# Patient Record
Sex: Male | Born: 1940 | Race: Black or African American | Hispanic: No | Marital: Married | Smoking: Never smoker
Health system: Southern US, Community
[De-identification: ages and names within clinical notes are randomized; demographics above are authoritative.]

## PROBLEM LIST (undated history)

## (undated) DIAGNOSIS — I712 Thoracic aortic aneurysm, without rupture, unspecified: Secondary | ICD-10-CM

## (undated) DIAGNOSIS — R339 Retention of urine, unspecified: Secondary | ICD-10-CM

## (undated) DIAGNOSIS — C61 Malignant neoplasm of prostate: Secondary | ICD-10-CM

## (undated) DIAGNOSIS — I351 Nonrheumatic aortic (valve) insufficiency: Secondary | ICD-10-CM

## (undated) DIAGNOSIS — M3320 Polymyositis, organ involvement unspecified: Secondary | ICD-10-CM

## (undated) DIAGNOSIS — I10 Essential (primary) hypertension: Secondary | ICD-10-CM

## (undated) HISTORY — DX: Retention of urine, unspecified: R33.9

## (undated) HISTORY — DX: Polymyositis, organ involvement unspecified: M33.20

## (undated) HISTORY — DX: Thoracic aortic aneurysm, without rupture, unspecified: I71.20

## (undated) HISTORY — DX: Essential (primary) hypertension: I10

## (undated) HISTORY — DX: Malignant neoplasm of prostate: C61

## (undated) HISTORY — PX: RADIOACTIVE SEED IMPLANT: SHX5150

## (undated) HISTORY — DX: Nonrheumatic aortic (valve) insufficiency: I35.1

## (undated) HISTORY — DX: Thoracic aortic aneurysm, without rupture: I71.2

---

## 2000-06-12 HISTORY — PX: CARDIOVASCULAR STRESS TEST: SHX262

## 2002-01-31 ENCOUNTER — Emergency Department (HOSPITAL_COMMUNITY): Admission: EM | Admit: 2002-01-31 | Discharge: 2002-01-31 | Payer: Self-pay | Admitting: Emergency Medicine

## 2004-10-17 ENCOUNTER — Ambulatory Visit (HOSPITAL_COMMUNITY): Admission: RE | Admit: 2004-10-17 | Discharge: 2004-10-17 | Payer: Self-pay | Admitting: Gastroenterology

## 2005-10-31 ENCOUNTER — Encounter: Admission: RE | Admit: 2005-10-31 | Discharge: 2005-10-31 | Payer: Self-pay | Admitting: Cardiology

## 2006-09-09 ENCOUNTER — Encounter: Admission: RE | Admit: 2006-09-09 | Discharge: 2006-09-09 | Payer: Self-pay | Admitting: Endocrinology

## 2007-10-29 ENCOUNTER — Encounter: Admission: RE | Admit: 2007-10-29 | Discharge: 2007-10-29 | Payer: Self-pay | Admitting: Cardiology

## 2007-12-01 ENCOUNTER — Ambulatory Visit: Payer: Self-pay | Admitting: Pulmonary Disease

## 2007-12-01 LAB — CONVERTED CEMR LAB
Angiotensin 1 Converting Enzyme: 62 units/L (ref 9–67)
Basophils Absolute: 0 10*3/uL (ref 0.0–0.1)
Basophils Relative: 0 % (ref 0.0–1.0)
Eosinophils Absolute: 0.3 10*3/uL (ref 0.0–0.6)
Eosinophils Relative: 4.6 % (ref 0.0–5.0)
HCT: 39.8 % (ref 39.0–52.0)
Hemoglobin: 13.9 g/dL (ref 13.0–17.0)
INR: 0.9 (ref 0.8–1.0)
Lymphocytes Relative: 24.8 % (ref 12.0–46.0)
MCHC: 34.9 g/dL (ref 30.0–36.0)
MCV: 87.3 fL (ref 78.0–100.0)
Monocytes Absolute: 0.9 10*3/uL — ABNORMAL HIGH (ref 0.2–0.7)
Monocytes Relative: 14 % — ABNORMAL HIGH (ref 3.0–11.0)
Neutro Abs: 3.6 10*3/uL (ref 1.4–7.7)
Neutrophils Relative %: 56.6 % (ref 43.0–77.0)
Platelets: 232 10*3/uL (ref 150–400)
Prothrombin Time: 11.5 s (ref 10.9–13.3)
RBC: 4.56 M/uL (ref 4.22–5.81)
RDW: 13.6 % (ref 11.5–14.6)
Sed Rate: 20 mm/hr (ref 0–20)
WBC: 6.4 10*3/uL (ref 4.5–10.5)
aPTT: 26.3 s (ref 21.7–29.8)

## 2007-12-03 ENCOUNTER — Telehealth (INDEPENDENT_AMBULATORY_CARE_PROVIDER_SITE_OTHER): Payer: Self-pay | Admitting: *Deleted

## 2007-12-04 ENCOUNTER — Encounter: Payer: Self-pay | Admitting: Pulmonary Disease

## 2007-12-05 ENCOUNTER — Ambulatory Visit: Payer: Self-pay | Admitting: Cardiology

## 2007-12-05 ENCOUNTER — Encounter: Payer: Self-pay | Admitting: Pulmonary Disease

## 2007-12-05 DIAGNOSIS — E785 Hyperlipidemia, unspecified: Secondary | ICD-10-CM | POA: Insufficient documentation

## 2007-12-05 DIAGNOSIS — I10 Essential (primary) hypertension: Secondary | ICD-10-CM | POA: Insufficient documentation

## 2007-12-11 ENCOUNTER — Telehealth: Payer: Self-pay | Admitting: Pulmonary Disease

## 2007-12-15 ENCOUNTER — Ambulatory Visit: Payer: Self-pay | Admitting: Pulmonary Disease

## 2007-12-15 DIAGNOSIS — J8410 Pulmonary fibrosis, unspecified: Secondary | ICD-10-CM | POA: Insufficient documentation

## 2007-12-16 DIAGNOSIS — J479 Bronchiectasis, uncomplicated: Secondary | ICD-10-CM

## 2008-01-05 ENCOUNTER — Ambulatory Visit: Payer: Self-pay | Admitting: Pulmonary Disease

## 2008-01-16 ENCOUNTER — Telehealth (INDEPENDENT_AMBULATORY_CARE_PROVIDER_SITE_OTHER): Payer: Self-pay | Admitting: *Deleted

## 2008-01-19 ENCOUNTER — Ambulatory Visit: Payer: Self-pay | Admitting: Pulmonary Disease

## 2008-01-19 ENCOUNTER — Inpatient Hospital Stay (HOSPITAL_COMMUNITY): Admission: AD | Admit: 2008-01-19 | Discharge: 2008-01-29 | Payer: Self-pay | Admitting: Pulmonary Disease

## 2008-01-19 DIAGNOSIS — R0602 Shortness of breath: Secondary | ICD-10-CM | POA: Insufficient documentation

## 2008-01-20 ENCOUNTER — Encounter: Payer: Self-pay | Admitting: Pulmonary Disease

## 2008-01-22 ENCOUNTER — Encounter: Payer: Self-pay | Admitting: Pulmonary Disease

## 2008-01-30 ENCOUNTER — Telehealth: Payer: Self-pay | Admitting: Pulmonary Disease

## 2008-02-05 ENCOUNTER — Encounter: Payer: Self-pay | Admitting: Pulmonary Disease

## 2008-02-05 ENCOUNTER — Ambulatory Visit: Payer: Self-pay | Admitting: Pulmonary Disease

## 2008-02-05 DIAGNOSIS — J82 Pulmonary eosinophilia, not elsewhere classified: Secondary | ICD-10-CM

## 2008-02-05 DIAGNOSIS — J8289 Other pulmonary eosinophilia, not elsewhere classified: Secondary | ICD-10-CM | POA: Insufficient documentation

## 2008-02-05 DIAGNOSIS — J9600 Acute respiratory failure, unspecified whether with hypoxia or hypercapnia: Secondary | ICD-10-CM | POA: Insufficient documentation

## 2008-02-12 ENCOUNTER — Telehealth: Payer: Self-pay | Admitting: Pulmonary Disease

## 2008-03-11 ENCOUNTER — Ambulatory Visit: Payer: Self-pay | Admitting: Pulmonary Disease

## 2008-04-21 ENCOUNTER — Ambulatory Visit: Payer: Self-pay | Admitting: Cardiology

## 2008-04-23 ENCOUNTER — Ambulatory Visit: Payer: Self-pay | Admitting: Pulmonary Disease

## 2008-05-27 ENCOUNTER — Telehealth: Payer: Self-pay | Admitting: Pulmonary Disease

## 2008-05-31 ENCOUNTER — Ambulatory Visit: Payer: Self-pay | Admitting: Pulmonary Disease

## 2008-05-31 ENCOUNTER — Ambulatory Visit: Payer: Self-pay | Admitting: Internal Medicine

## 2008-06-21 ENCOUNTER — Ambulatory Visit: Payer: Self-pay | Admitting: Pulmonary Disease

## 2008-07-23 ENCOUNTER — Ambulatory Visit: Payer: Self-pay | Admitting: Pulmonary Disease

## 2008-08-10 ENCOUNTER — Encounter: Payer: Self-pay | Admitting: Pulmonary Disease

## 2008-08-18 ENCOUNTER — Encounter: Payer: Self-pay | Admitting: Pulmonary Disease

## 2008-09-02 ENCOUNTER — Encounter: Payer: Self-pay | Admitting: Pulmonary Disease

## 2008-09-16 ENCOUNTER — Encounter: Payer: Self-pay | Admitting: Pulmonary Disease

## 2008-10-21 ENCOUNTER — Ambulatory Visit: Payer: Self-pay | Admitting: Pulmonary Disease

## 2008-10-21 DIAGNOSIS — J849 Interstitial pulmonary disease, unspecified: Secondary | ICD-10-CM

## 2009-01-23 IMAGING — CT CT CHEST W/O CM
2 of 3 series · 15 of 36 positions shown, 18 images · non-contrast
Comparison: Chest radiographs 12/01/07 and chest CT 10/31/05.

CLINICAL DATA: Cough and dyspnea.  Right middle lobe opacity on chest radiographs.
CHEST CT WITHOUT CONTRAST ? 12/05/07:
TECHNIQUE: Multidetector CT imaging of the chest was performed following the standard protocol without IV contrast. The current examination does include high-resolution images through the lungs.

[Series 2: chest_hi_res 5.0 b40f st · axial · 0.76mm/px · z∈[+1094,+1384]mm · 12 of 68 slices shown, 15 images]
[im 5/68  mediastinal]
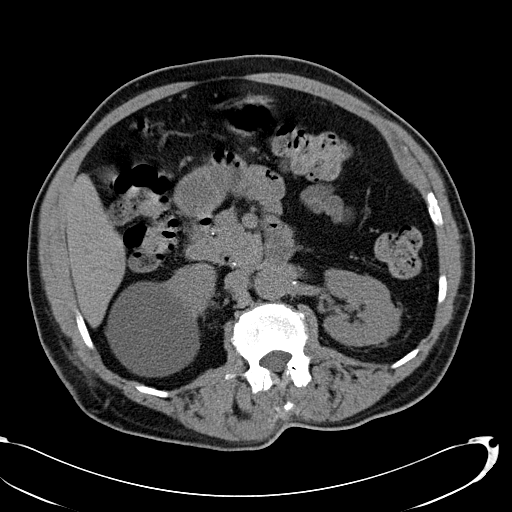
[im 5/68  lung]
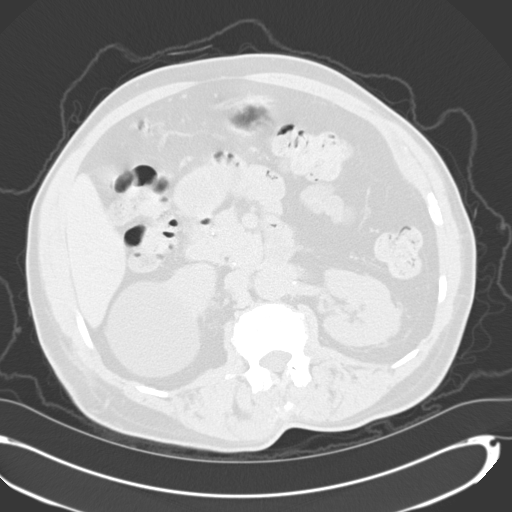
[im 10/68  lung]
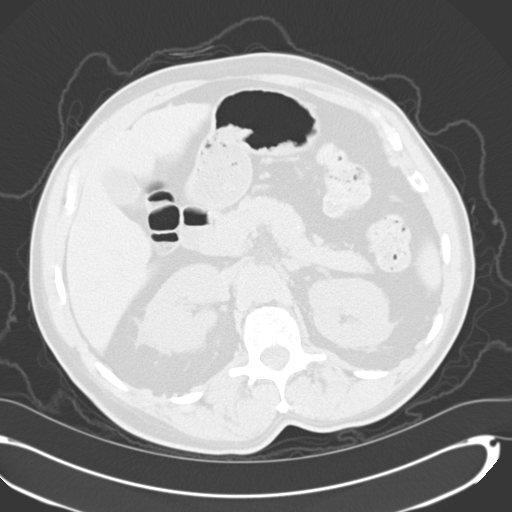
[im 15/68  lung]
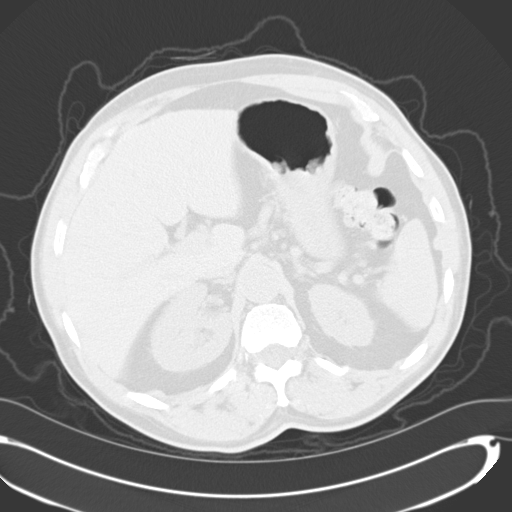
[im 20/68  lung]
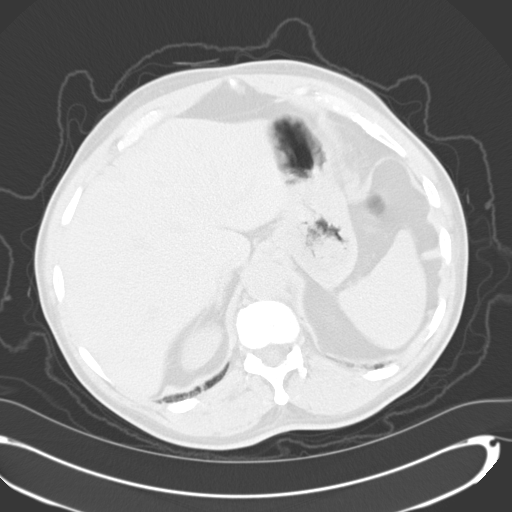
[im 25/68  mediastinal]
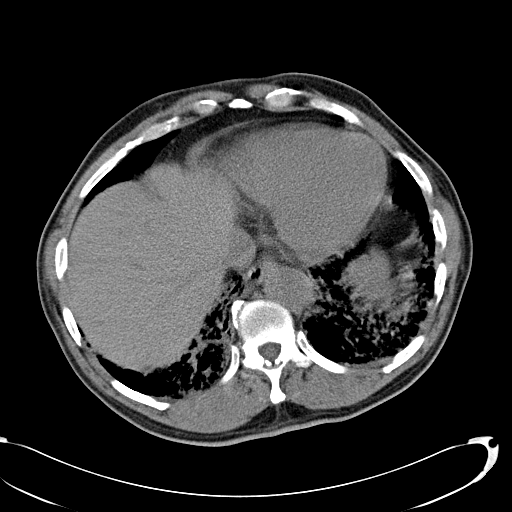
[im 25/68  lung]
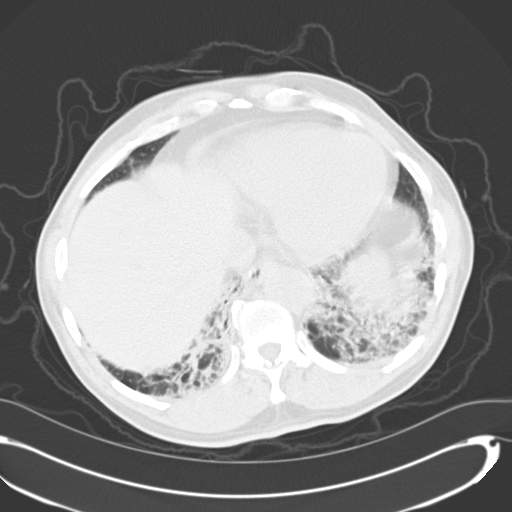
[im 30/68  lung]
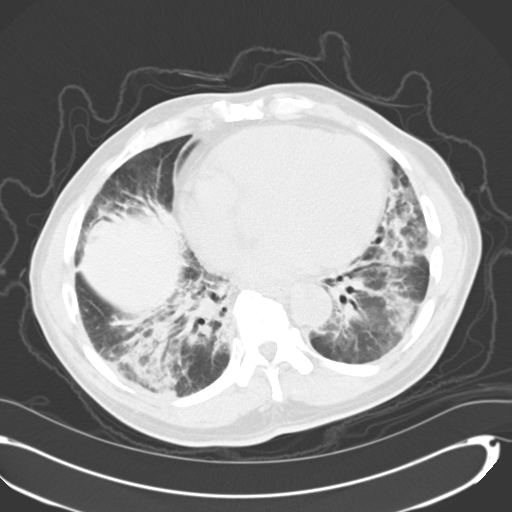
[im 38/68  lung]
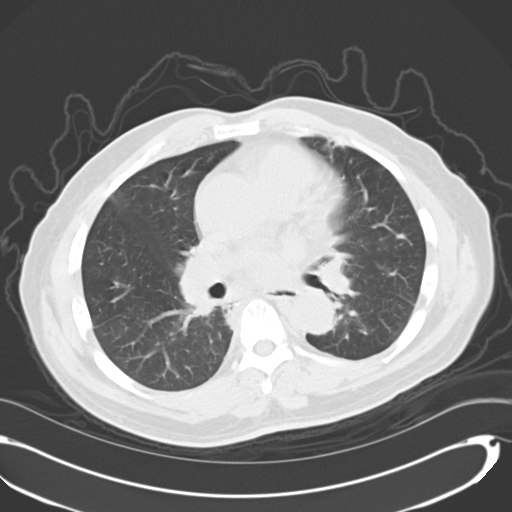
[im 43/68  lung]
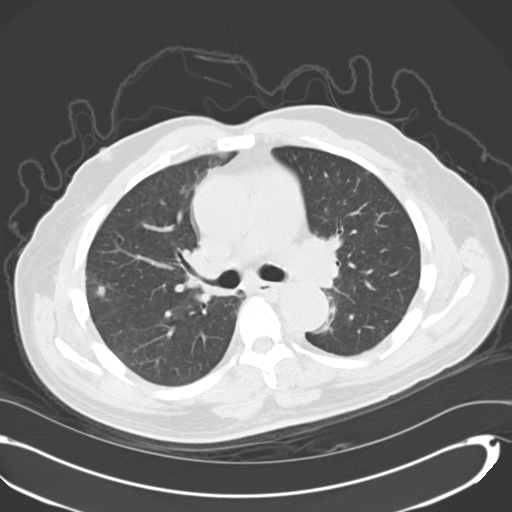
[im 48/68  mediastinal]
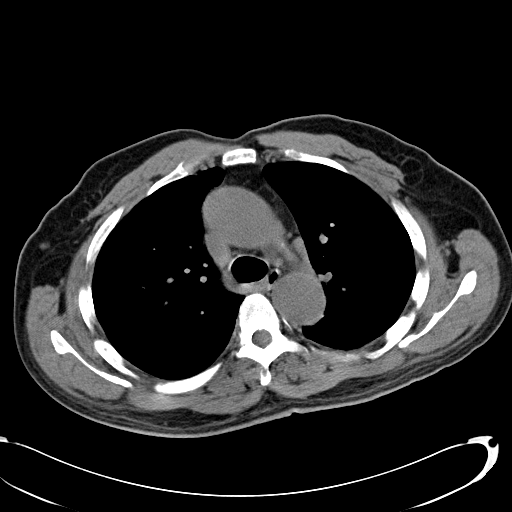
[im 48/68  lung]
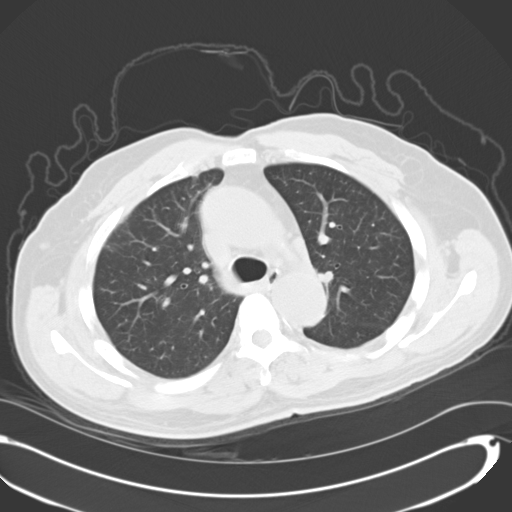
[im 53/68  lung]
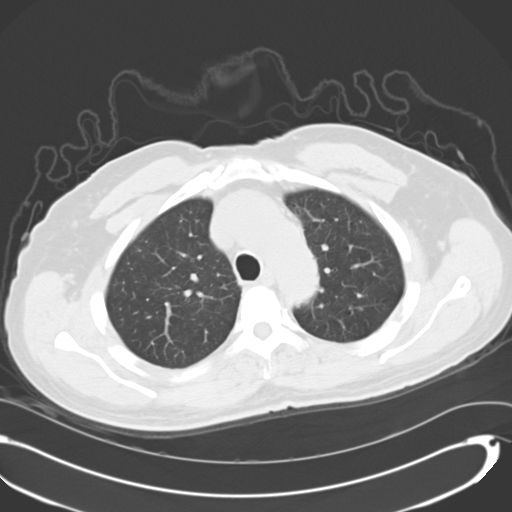
[im 58/68  lung]
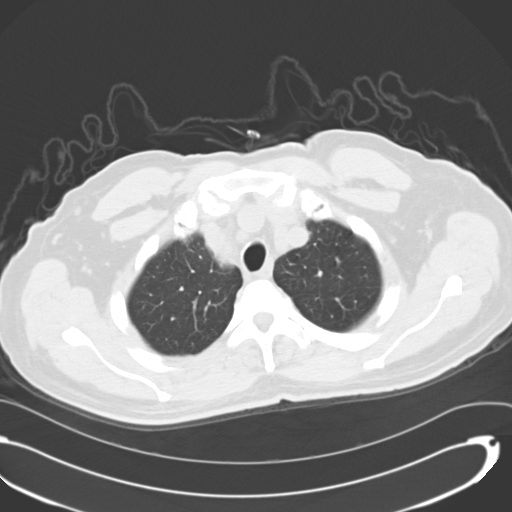
[im 63/68  lung]
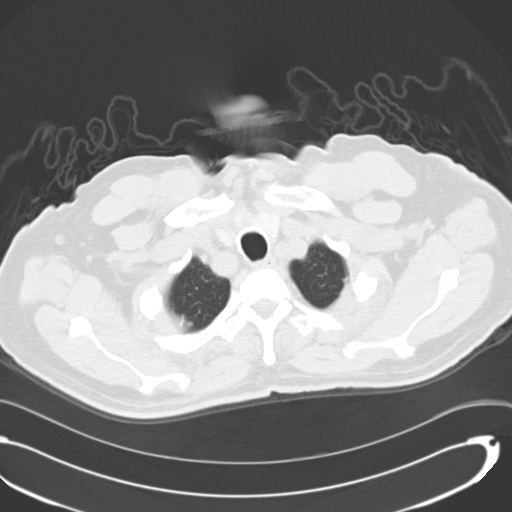

[Series 602: <mpr thick range> · coronal · 0.76mm/px · 3 of 94 slices shown]
[im 19/94  lung]
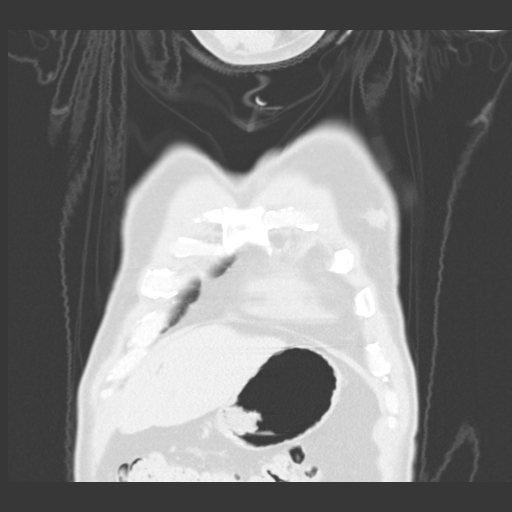
[im 38/94  lung]
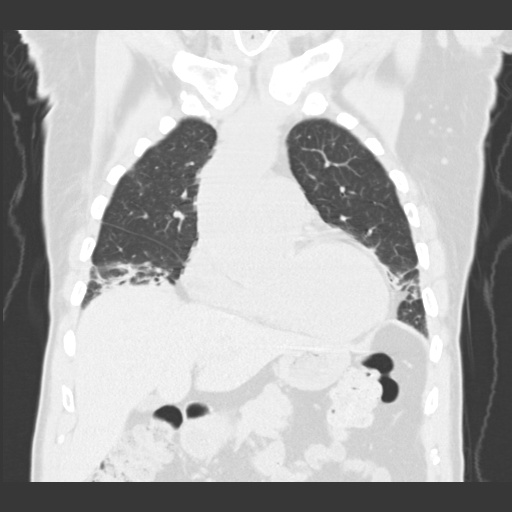
[im 56/94  lung]
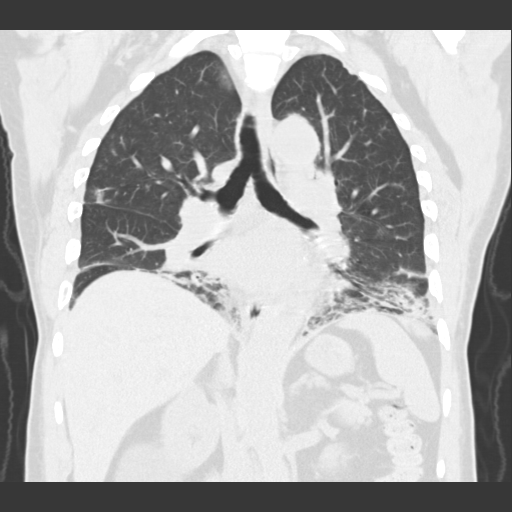

[15 of 36 positions shown; findings below may reference images not displayed]

FINDINGS: There is stable mild cardiac enlargement.  No significant pleural or pericardial effusion is present. There is stable ectasia of the aortic root and pulmonary arteries centrally.  The brachiocephalic artery is also mildly dilated, but appears unchanged. There are no enlarged mediastinal or hilar lymph nodes.  
Lung windows demonstrate bibasilar air space opacities with air bronchograms and bronchiectasis.  These have developed since the patient?s prior CT.  Compared with the chest radiographs done four days ago, some improvement is suspected.  More superiorly, there are focal nodular densities adjacent to the right major and minor fissures on images #25 through #29.  No dominant lung mass or endobronchial lesion is seen.
Images through the upper abdomen demonstrate no adrenal mass. There is a 7.4 x 8.2 cm cyst involving the lower pole of the right kidney. This was not imaged previously.
IMPRESSION: Bibasilar air space opacities are suspected to have both acute and chronic components with slight improvement compared with the radiographs of four days prior.  Chronicity is implied by architectural distortion and bronchiectasis and suggests possible chronic aspiration or chronic inflammation.  Small nodular densities on the right are likely related. 
Clinical correlation and chest radiographic follow-up are recommended.

## 2009-02-28 ENCOUNTER — Ambulatory Visit: Payer: Self-pay | Admitting: Pulmonary Disease

## 2009-03-01 ENCOUNTER — Ambulatory Visit: Admission: RE | Admit: 2009-03-01 | Discharge: 2009-05-30 | Payer: Self-pay | Admitting: Radiation Oncology

## 2009-03-31 ENCOUNTER — Encounter: Admission: RE | Admit: 2009-03-31 | Discharge: 2009-03-31 | Payer: Self-pay | Admitting: Urology

## 2009-05-04 ENCOUNTER — Ambulatory Visit (HOSPITAL_BASED_OUTPATIENT_CLINIC_OR_DEPARTMENT_OTHER): Admission: RE | Admit: 2009-05-04 | Discharge: 2009-05-04 | Payer: Self-pay | Admitting: Urology

## 2009-06-02 ENCOUNTER — Ambulatory Visit: Admission: RE | Admit: 2009-06-02 | Discharge: 2009-06-14 | Payer: Self-pay | Admitting: Radiation Oncology

## 2009-08-29 ENCOUNTER — Ambulatory Visit: Payer: Self-pay | Admitting: Pulmonary Disease

## 2009-10-21 ENCOUNTER — Encounter: Payer: Self-pay | Admitting: Pulmonary Disease

## 2009-11-09 ENCOUNTER — Encounter: Payer: Self-pay | Admitting: Pulmonary Disease

## 2010-02-27 ENCOUNTER — Ambulatory Visit: Payer: Self-pay | Admitting: Pulmonary Disease

## 2010-02-27 ENCOUNTER — Encounter: Payer: Self-pay | Admitting: Pulmonary Disease

## 2010-03-03 ENCOUNTER — Encounter: Payer: Self-pay | Admitting: Pulmonary Disease

## 2010-08-28 ENCOUNTER — Ambulatory Visit: Payer: Self-pay | Admitting: Pulmonary Disease

## 2010-11-30 ENCOUNTER — Encounter: Payer: Self-pay | Admitting: Pulmonary Disease

## 2010-11-30 ENCOUNTER — Ambulatory Visit: Payer: Self-pay | Admitting: Cardiology

## 2010-12-05 ENCOUNTER — Encounter: Admission: RE | Admit: 2010-12-05 | Discharge: 2010-12-05 | Payer: Self-pay | Admitting: Cardiology

## 2010-12-05 ENCOUNTER — Ambulatory Visit: Payer: Self-pay

## 2010-12-05 ENCOUNTER — Encounter: Payer: Self-pay | Admitting: Cardiology

## 2010-12-05 ENCOUNTER — Encounter: Payer: Self-pay | Admitting: Pulmonary Disease

## 2010-12-05 ENCOUNTER — Ambulatory Visit (HOSPITAL_COMMUNITY)
Admission: RE | Admit: 2010-12-05 | Discharge: 2010-12-05 | Payer: Self-pay | Source: Home / Self Care | Admitting: Cardiology

## 2010-12-05 HISTORY — PX: US ECHOCARDIOGRAPHY: HXRAD669

## 2011-01-21 ENCOUNTER — Encounter: Payer: Self-pay | Admitting: Cardiology

## 2011-01-30 NOTE — Letter (Signed)
Summary: [facility]   Imported By: Sherian Rein 02/15/2010 14:17:34  _____________________________________________________________________  External Attachment:    Type:   Image     Comment:   External Document

## 2011-01-30 NOTE — Letter (Signed)
Summary: [facility]  [facility]   Imported By: Lanelle Bal 03/27/2010 14:13:09  _____________________________________________________________________  External Attachment:    Type:   Image     Comment:   External Document

## 2011-01-30 NOTE — Miscellaneous (Signed)
Summary: Orders Update  Clinical Lists Changes  Orders: Added new Test order of T-2 View CXR (71020TC) - Signed 

## 2011-01-30 NOTE — Assessment & Plan Note (Signed)
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  Pt states breathing is unchanged from last visit.  Pt denied any new complaints.  Pt is currently at 2.5mg  of Prednisone.  Pt refused to get scheduled cxr today.  Marland Kitchen  History of Present Illness: The pt comes in today for f/u of his pulmonary infiltrates/dyspnea related to his polymyositis.  He has been doing very well on his immunosuppressive regimen under the guidance of rheumatology.  He reports no worsening sob, cough, congestion, or purulence.  He is due for his f/u cxr.  Current Medications (verified): 1)  Janumet 50-1000 Mg Tabs (Sitagliptin-Metformin Hcl) .... Take 1 Tablet By Mouth Two Times A Day 2)  Azor 5-40 Mg Tabs (Amlodipine-Olmesartan) .... Take 1 Tablet By Mouth Once A Day 3)  Carvedilol 25 Mg Tabs (Carvedilol) .... Take 1/2 Tab By Mouth in The Morning and 1 Tab By Mouth At Bedtime 4)  Finasteride 5 Mg  Tabs (Finasteride) .... Once Daily 5)  Adult Aspirin Ec Low Strength 81 Mg  Tbec (Aspirin) .... Once Daily 6)  Multi , B12 and Vit D3 .... Once Daily 7)  Flomax 0.4 Mg  Cp24 (Tamsulosin Hcl) .... Take 1 Tab By Mouth Every 3 Days 8)  Glimepiride 4 Mg  Tabs (Glimepiride) .... Take 1/2  Tab By Mouth Each Morning 9)  Viagra 100 Mg  Tabs (Sildenafil Citrate) .... As Needed 10)  Prednisone 5 Mg Tabs (Prednisone) .... Take 1/2 Tab By Mouth Daily 11)  Imuran 50 Mg Tabs (Azathioprine) .... Take 3 Tabs By Mouth Daily 12)  Actos 30 Mg Tabs (Pioglitazone Hcl) .... Take 1 Tablet By Mouth Once A Day 13)  Simvastatin 20 Mg Tabs (Simvastatin) .... Take 1 Tablet By Mouth Once A Day 14)  Vitamin D 1000 Unit Tabs (Cholecalciferol) .... Take 1 Tablet By Mouth Once A Day 15)  Vitamin B-12 1000 Mcg Tabs (Cyanocobalamin) .... Take 1 Tablet By Mouth Once A Day  Allergies (verified): 1)  ! * Lisinopril 2)  ! Sulfa  Review of  Systems      See HPI  Vital Signs:  Patient profile:   70 year old male Height:      75.5 inches Weight:      215.25 pounds BMI:     26.65 O2 Sat:      97 % on Room air Temp:     98.0 degrees F oral Pulse rate:   72 / minute BP sitting:   120 / 60  (left arm) Cuff size:   regular  Vitals Entered By: Arman Filter LPN (February 27, 2010 9:11 AM)  O2 Flow:  Room air CC: Pt is here for a 6 month f/u appt.  Pt states breathing is unchanged from last visit.  Pt denied any new complaints.  Pt is currently at 2.5mg  of Prednisone.  Pt refused to get scheduled cxr today.   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  He is having no increased sob, and is staying  very active.  He is due for a cxr for f/u, and I will call him with the results.    Medications Added to Medication List This Visit: 1)  Janumet 50-1000 Mg Tabs (Sitagliptin-metformin hcl) .... Take 1 tablet by mouth two times a day 2)  Azor 5-40 Mg Tabs (Amlodipine-olmesartan) .... Take 1 tablet by mouth once a day 3)  Flomax 0.4 Mg Cp24 (Tamsulosin hcl) .... Take 1 tab by mouth every 3 days 4)  Glimepiride 4 Mg Tabs (Glimepiride) .... Take 1/2  tab by mouth each morning 5)  Prednisone 5 Mg Tabs (Prednisone) .... Take 1/2 tab by mouth daily 6)  Simvastatin 20 Mg Tabs (Simvastatin) .... Take 1 tablet by mouth once a day 7)  Vitamin D 1000 Unit Tabs (Cholecalciferol) .... Take 1 tablet by mouth once a day 8)  Vitamin B-12 1000 Mcg Tabs (Cyanocobalamin) .... Take 1 tablet by mouth once a day  Other Orders: Est. Patient Level II (62952) T-2 View CXR  (71020TC)  Patient Instructions: 1)  will send down for a cxr. 2)  no change in meds. 3)  followup with me in 6mos.

## 2011-01-30 NOTE — Assessment & Plan Note (Signed)
Summary: rov for autoimmune pulmonary disease   Copy to:  Jordan/Deveshwar Primary Provider/Referring Provider:  Lucianne Muss  CC:  Pt is here for a 6 month f/u appt.  Pt is no longer on Prednisone.  Pt denied any changes to  breathing since last visit.  Pt denied a cough, sore throat, headache, and or nasal congestion. 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  The pt has been doing very well from a pulmonary standpoint, and has had no recurrence of his pulmonary symptoms since being off prednisone.  He is continuing on imuran under the direction of rheumatology.  He denies any cough, congestion, mucus, or worsening sob.  He has been trying to stay active.  Current Medications (verified): 1)  Janumet 50-1000 Mg Tabs (Sitagliptin-Metformin Hcl) .... Take 1 Tablet By Mouth Two Times A Day 2)  Azor 5-40 Mg Tabs (Amlodipine-Olmesartan) .... Take 1 Tablet By Mouth Once A Day 3)  Carvedilol 25 Mg Tabs (Carvedilol) .... Take 1 Tablet By Mouth Two Times A Day 4)  Finasteride 5 Mg  Tabs (Finasteride) .... Once Daily 5)  Adult Aspirin Ec Low Strength 81 Mg  Tbec (Aspirin) .... Once Daily 6)  Multi , B12 and Vit D3 .... Once Daily 7)  Flomax 0.4 Mg  Cp24 (Tamsulosin Hcl) .... Take 1 Tablet By Mouth Once A Day 8)  Viagra 100 Mg  Tabs (Sildenafil Citrate) .... As Needed 9)  Imuran 50 Mg Tabs (Azathioprine) .... Take 2 Tabs By Mouth Daily 10)  Actos 30 Mg Tabs (Pioglitazone Hcl) .... Take 1 Tablet By Mouth Once A Day 11)  Simvastatin 20 Mg Tabs (Simvastatin) .... Take 1 Tablet By Mouth Once A Day 12)  Vitamin D 1000 Unit Tabs (Cholecalciferol) .... Take 1 Tablet By Mouth Once A Day 13)  Fabb 2.2-25-1 Mg Tabs (Folic Acid-Vit B6-Vit B12) .... Take 1 Tablet By Mouth Once A Day  Allergies (verified): 1)  ! * Lisinopril 2)  ! Sulfa  Review of Systems  The patient denies shortness of breath with activity, shortness of breath at rest,  productive cough, non-productive cough, coughing up blood, chest pain, irregular heartbeats, acid heartburn, indigestion, loss of appetite, weight change, abdominal pain, difficulty swallowing, sore throat, tooth/dental problems, headaches, nasal congestion/difficulty breathing through nose, sneezing, itching, ear ache, anxiety, depression, hand/feet swelling, joint stiffness or pain, rash, change in color of mucus, and fever.    Vital Signs:  Patient profile:   70 year old male Height:      75.5 inches Weight:      212.13 pounds O2 Sat:      94 % on Room air Temp:     97.6 degrees F oral Pulse rate:   70 / minute BP sitting:   140 / 50  (left arm) Cuff size:   regular  Vitals Entered By: Arman Filter LPN (August 28, 2010 9:11 AM)  O2 Flow:  Room air CC: Pt is here for a 6 month f/u appt.  Pt is no longer on Prednisone.  Pt denied any changes to  breathing since last visit.  Pt denied a cough, sore throat, headache, or nasal congestion.  Comments Medications reviewed with patient Arman Filter LPN  August 28, 2010 9:12 AM    Physical Exam  General:  wd male in nad Lungs:  minimal basilar crackles, no wheezing Heart:  rrr Extremities:  no edema or cyanosis  Neurologic:  alert and  oriented, moves all 4.   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  He is getting consistent exercise, and has not had any flareups with his breathing.  I have asked him to work on modest weight loss, and to call if he has any worsening of his symptoms.  He will f/u with me in 6mos.  Medications Added to Medication List This Visit: 1)  Carvedilol 25 Mg Tabs (Carvedilol) .... Take 1 tablet by mouth two times a day 2)  Flomax 0.4 Mg Cp24 (Tamsulosin hcl) .... Take 1 tablet by mouth once a day 3)  Imuran 50 Mg Tabs (Azathioprine) .... Take 2 tabs  by mouth daily 4)  Fabb 2.2-25-1 Mg Tabs (Folic acid-vit b6-vit b12) .... Take 1 tablet by mouth once a day  Other Orders: Est. Patient Level III (29562)  Patient Instructions: 1)  continue to work on conditioning and weight loss 2)  followup with me in 6mos.

## 2011-01-30 NOTE — Letter (Signed)
Summary: [facility]  [facility]   Imported By: Sherian Rein 02/15/2010 13:14:26  _____________________________________________________________________  External Attachment:    Type:   Image     Comment:   External Document

## 2011-02-07 NOTE — Letter (Signed)
Summary: Peter Swaziland MD/[facility]  Peter Swaziland MD/[facility]   Imported By: Lester Bono 02/01/2011 10:24:51  _____________________________________________________________________  External Attachment:    Type:   Image     Comment:   External Document

## 2011-02-26 ENCOUNTER — Encounter: Payer: Self-pay | Admitting: Pulmonary Disease

## 2011-02-26 ENCOUNTER — Ambulatory Visit (INDEPENDENT_AMBULATORY_CARE_PROVIDER_SITE_OTHER): Payer: Medicare Other | Admitting: Pulmonary Disease

## 2011-02-26 DIAGNOSIS — J8410 Pulmonary fibrosis, unspecified: Secondary | ICD-10-CM

## 2011-02-26 DIAGNOSIS — J8409 Other alveolar and parieto-alveolar conditions: Secondary | ICD-10-CM

## 2011-02-26 DIAGNOSIS — J479 Bronchiectasis, uncomplicated: Secondary | ICD-10-CM

## 2011-03-08 NOTE — Assessment & Plan Note (Signed)
Summary: rov for pulmonary infiltrates.   Copy to:  Jordan/Deveshwar Primary Provider/Referring Provider:  Lucianne Muss  CC:  6 month f/u appt.   Pt denied sob or cough.  Pt does c/o occ headaches x last 2 to 3 weeks and wonders if this is d/t nasal congestion. Marland Kitchen  History of Present Illness: the pt comes in today for f/u of his known pulmonary disease related to his polymyositis.  He has been doing very well since starting imuran under the guidance of rheum.  He is exercising regularly, and feels his pulmonary status has been stable.  He has no significant doe or cough.  He has not had any hemoptysis.    Current Medications (verified): 1)  Janumet 50-1000 Mg Tabs (Sitagliptin-Metformin Hcl) .... Take 1 Tablet By Mouth Two Times A Day 2)  Azor 5-40 Mg Tabs (Amlodipine-Olmesartan) .... Take 1 Tablet By Mouth Once A Day 3)  Carvedilol 25 Mg Tabs (Carvedilol) .... Take 1 Tablet By Mouth Two Times A Day 4)  Finasteride 5 Mg  Tabs (Finasteride) .... Once Daily 5)  Adult Aspirin Ec Low Strength 81 Mg  Tbec (Aspirin) .... Once Daily 6)  Multi , B12 and Vit D3 .... Once Daily 7)  Flomax 0.4 Mg  Cp24 (Tamsulosin Hcl) .... Take 1 Tablet By Mouth Once A Day 8)  Viagra 100 Mg  Tabs (Sildenafil Citrate) .... As Needed 9)  Imuran 50 Mg Tabs (Azathioprine) .... Take 2 Tabs By Mouth Daily 10)  Actos 30 Mg Tabs (Pioglitazone Hcl) .... Take 1 Tablet By Mouth Once A Day 11)  Simvastatin 20 Mg Tabs (Simvastatin) .... Take 1 Tablet By Mouth Once A Day 12)  Vitamin D 1000 Unit Tabs (Cholecalciferol) .... Take 1 Tablet By Mouth Once A Day 13)  Fabb 2.2-25-1 Mg Tabs (Folic Acid-Vit B6-Vit B12) .... Take 1 Tablet By Mouth Once A Day 14)  Lumigan 0.01 % Soln (Bimatoprost) .... Use As Directed  Allergies (verified): 1)  ! * Lisinopril 2)  ! 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  The patient denies shortness of breath with activity, shortness of breath at rest, productive cough, non-productive cough, coughing up blood, chest pain, irregular heartbeats, acid heartburn, indigestion, loss of appetite, weight change, abdominal pain, difficulty swallowing, sore throat, tooth/dental problems, sneezing, itching, anxiety, depression, hand/feet swelling, rash, change in color of mucus, and fever.    Vital Signs:  Patient profile:   70 year old male Height:      75.5 inches Weight:      211.50 pounds BMI:     26.18 O2 Sat:      100 % on Room air Temp:     97.5 degrees F oral Pulse rate:   62 / minute BP sitting:   134 / 76  (left arm) Cuff size:   regular  Vitals Entered By: Arman Filter LPN (February 26, 2011 9:17 AM)  O2 Flow:  Room air CC: 6 month f/u appt.   Pt denied sob or cough.  Pt does c/o occ headaches x last 2 to 3 weeks and wonders if this is d/t nasal congestion.  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  He has done well since being treated with imuran by rheum.  He tells me his CPK's are down.  He has no significant pulmonary symptoms at this time.  Will see back in 6mos,  and will check cxr on the same day.    Medications Added to Medication List This Visit: 1)  Lumigan 0.01 % Soln (Bimatoprost) .... Use as directed  Other Orders: Est. Patient Level III (40981)  Patient Instructions: 1)  no change in treatment.  Continue your exercise program 2)  followup with me in 6mos, with chest xray before the visit.

## 2011-04-10 LAB — GLUCOSE, CAPILLARY
Glucose-Capillary: 151 mg/dL — ABNORMAL HIGH (ref 70–99)
Glucose-Capillary: 166 mg/dL — ABNORMAL HIGH (ref 70–99)

## 2011-04-11 LAB — COMPREHENSIVE METABOLIC PANEL
ALT: 21 U/L (ref 0–53)
Albumin: 3.7 g/dL (ref 3.5–5.2)
Alkaline Phosphatase: 35 U/L — ABNORMAL LOW (ref 39–117)
Potassium: 4.3 mEq/L (ref 3.5–5.1)
Sodium: 139 mEq/L (ref 135–145)
Total Protein: 6.7 g/dL (ref 6.0–8.3)

## 2011-04-11 LAB — CBC
HCT: 39.3 % (ref 39.0–52.0)
Hemoglobin: 13.2 g/dL (ref 13.0–17.0)
MCHC: 33.5 g/dL (ref 30.0–36.0)
MCV: 95.8 fL (ref 78.0–100.0)
Platelets: 178 10*3/uL (ref 150–400)
RBC: 4.1 MIL/uL — ABNORMAL LOW (ref 4.22–5.81)
RDW: 15.1 % (ref 11.5–15.5)
WBC: 5.1 10*3/uL (ref 4.0–10.5)

## 2011-04-11 LAB — PROTIME-INR: INR: 1.1 (ref 0.00–1.49)

## 2011-05-09 ENCOUNTER — Other Ambulatory Visit: Payer: Self-pay | Admitting: Endocrinology

## 2011-05-09 DIAGNOSIS — E041 Nontoxic single thyroid nodule: Secondary | ICD-10-CM

## 2011-05-15 NOTE — H&P (Signed)
NAME:  Mark Daugherty, Mark Daugherty               ACCOUNT NO.:  [id number]   MEDICAL RECORD NO.:  [id number]          PATIENT TYPE:  INP   LOCATION:  1309                         [facility]   PHYSICIAN:  Barbaraann Share, MD,FCCPDATE OF BIRTH:  1939-11-15   DATE OF ADMISSION:  01/19/2008  DATE OF DISCHARGE:                              HISTORY & PHYSICAL   CHIEF COMPLAINT:  Cough, congestion and shortness of breath that have  progressively worsened over the last 2 months.   HISTORY OF PRESENT ILLNESS:  This is a 70 year old African American male  patient of Dr. Shelle Iron.  The patient returns today complaining of  persistent cough, congestion and shortness of breath.  The patient  reports that about 2 months ago he started having progressively  worsening cough and  congestion.  He received antibiotics.  Symptoms  minimally improved.  A chest x-ray showed bilateral pulmonary  infiltrates.  A subsequent CT chest showed bibasilar airspace opacities  suspected to have both acute and chronic components with a slight  improvement compared to previous chest x-ray.  There were also small  nodular densities on the right, and there was a suspected bronchiectasis  with possible chronic aspiration or chronic inflammation.  The patient  had returned back to the office 2 weeks ago, reporting that his symptoms  were slowly improving and the patient was slowly improving.  The patient  returns today, reporting that over the last 10 days his symptoms have  started to return and have progressively worsened to the point where he  is short of breath with minimal activity.  He denies any hemoptysis,  orthopnea, PND, or leg swelling.  The patient does have a cough with  congestion; however, mucus is clear to white.  The patient complains he  feels extremely thirsty with dry mouth.  He has had some low-grade  fevers.  Denies any fevers over 101.   PAST MEDICAL HISTORY:  1. Hypertension.  2. Diabetes mellitus.  3.  Hyperlipidemia.  4. BPH.  5. Aortic aneurysm.   CURRENT MEDICATIONS:  1. Metformin 1000 mg b.i.d.  2. Amaryl 4 mg daily.  3. Azor 10/40 mg daily.  4. Finasteride 5 mg daily.  5. Crestor 5 mg daily.  6. Aspirin 81 mg daily.  7. Multivitamin daily.  8. Coreg 12.5 mg b.i.d.  9. Symbicort 160/4.5 2 puffs twice daily.   DRUG ALLERGIES:  1. LISINOPRIL.  2. SULFA.   FAMILY HISTORY:  The patient is married.  He is a never smoker.  Has  three children.  He denies any alcohol use.   FAMILY HISTORY:  Positive for diabetes, hypertension, heart disease, and  prostate, liver and pancreatic cancer.   REVIEW OF SYSTEMS:  Essentially negative except as noted above.   PHYSICAL EXAMINATION:  GENERAL:  The patient is an elderly male in no  acute distress.  VITAL SIGNS:  Temperature 97.3, blood pressure 150/60, O2 saturation was  82% on arrival, on room air.  Pulse is 106.  Weight is at 202.  HEENT: Atraumatic.  Normocephalic.  Posterior pharynx is clear.  NECK:  Supple, without  cervical adenopathy.  No JVD.  Carotids are equal  with positive upstrokes bilaterally, without any bruits noted.  LUNG SOUNDS:  Coarse breath sounds bilaterally with some bibasilar  crackles.  CARDIAC:  Regular rate and rhythm, without murmur, rub or gallop.  ABDOMEN:  Soft, nontender.  No palpable hepatosplenomegaly.  No  abdominal bruits or masses are appreciated.  EXTREMITIES:  Warm, without any calf cyanosis, clubbing or edema.   DATA:  Chest x-ray today, in the office, showed no significant changes  in the bilateral pulmonary infiltrates.   IMPRESSION AND PLAN:  1. Bilateral pulmonary infiltrates with associated hypoxemia.  The      patient will be admitted, started on IV antibiotics, oxygen and      nebulized bronchodilators.  Labs, including sed rate CBC, CMET, BNP      are pending.  Sputum culture with AFB and urine for Legionella      antigen are pending.  As condition improves, will need to evaluate       for possible fiberoptic bronchoscopy.  The patient will be placed      on GI prophylaxis with Protonix and DVT prophylaxis with Lovenox.  2. Diabetes mellitus.  We will hold metformin for now.  Will check      CBGs daily.  Continue on Amaryl daily.      Rubye Oaks, NP      Barbaraann Share, MD,FCCP  Electronically Signed    TP/MEDQ  D:  01/19/2008  T:  01/20/2008  Job:  [id number]   cc:   Reather Littler, M.D.  Fax: [phone]   Peter M. Swaziland, M.D.  Fax: [phone]

## 2011-05-15 NOTE — Assessment & Plan Note (Signed)
[facility]                             PULMONARY OFFICE NOTE   NAME:Gemme, Syler                        MRN:          [id number]  DATE:12/01/2007                            DOB:          1941-08-05    HISTORY OF PRESENT ILLNESS:  The patient is a very pleasant black  gentleman whom I have been asked to see for abnormal chest x-ray and  persistent pulmonary symptoms.  The patient states that he has a long  history of allergies, but sine the summer has had more difficulties with  this as well as some wheezing noted in the evening after mowing the  grass.  He has really not had any shortness of breath or limitation in  his activities during the summer.  However, this past September, he  began to notice slowly increasing shortness of breath of unknown  etiology that would start to interfere with some of his activities.  He  was felt to possibly have asthma and was tried on a Flovent inhaler.  Since that time, his symptoms have gotten worse.  He started developing  a cough around the beginning of November 2008, with no purulence, and  only white scant mucus.  The mucus was primarily in the mornings.  There  was no hemoptysis.  The patient was seen by Dr. Lucianne Muss on November 07, 2007, with these worsening symptoms, and a chest x-ray showed bibasilar  infiltrates and possibly lymphadenopathy. The patient was placed on  Avelox that he took for 7 days.  He saw no big difference in his  symptoms.  The patient did not have any fevers or chills, but did have  night sweats on two occasions, which was severe enough to require  changing his bedclothes.  The patient had a followup with Dr. Lucianne Muss on  November 19, 2007, where his chest x-ray was unchanged, and his symptoms  had actually either stayed the same or gotten worse.  Currently, the  patient states that his cough and shortness of breath are still present,  with moderate activity, but he thinks they may have  gotten better over  the last 7-10 days.  Of note, the patient did have a CT scan of the  chest on November 01, 2007 with Dr. [place] to look at his aorta and had  no significant abnormality of the chest, except for a very small tiny  nodule at the right base.  There were no infiltrates noted on that scan  or lymphadenopathy.  The patient has no history of TB exposure, and has  had a negative PPD in the past.  He has no unusual pets or hobbies.  He  has not been around any unusual material other than a [place] that he  uses along the bank of his pond and also a chemical that he puts in his  pond water as well that he did not breathe in.   PAST MEDICAL HISTORY:  1. Hypertension.  2. History of dilated ascending aorta that is being watched very      carefully.  3. History of diabetes.  4. History of allergic rhinitis.   CURRENT MEDICATIONS:  1. Metformin 1000 mg b.i.d.  2. Glimepiride 4 mg daily.  3. Azor 10/40 daily.  4. Carvedilol 12.5, 2 daily.  5. Finasteride 5 mg daily.  6. Crestor 5 mg every other day.  7. Aspirin 81 mg daily.  8. Flomax 0.4 mg p.r.n.  9. Viagra 100 mg p.r.n.   The patient has intolerance/allergy to LISINOPRIL and SULFA DRUGS.   SOCIAL HISTORY:  He is married and has children.  He is a retired  Software engineer.  He has never smoked.   FAMILY HISTORY:  Remarkable for his brother having heart disease.  Sister had pancreatic and liver cancer.   REVIEW OF SYSTEMS:  As per history of present illness.  Also, see  patient intake form in the chart.   PHYSICAL EXAMINATION:  GENERAL:  He is a well-developed male in no acute  distress.  VITAL SIGNS:  Blood pressure 122/68, pulse 84, temperature 98.4, weight  207 pounds.  He is 6 feet 3-1/2 inches tall.  O2 saturation on room air  is 95%.  HEENT:  Pupils equal, round, and reactive to light and accommodation.  Extraocular muscles are intact.  Nares are patent, without discharge.  Oropharynx is clear.  NECK:   Supple, without JVD or lymphadenopathy.  There is no palpable  thyromegaly.  CHEST:  Reveals bibasilar crackles approximately a third of the way up,  but no wheezes or rhonchi.  CARDIAC:  Reveals regular rate and rhythm, with a 3/6 systolic murmur.  ABDOMEN:  Soft, nontender, with good bowel sounds.  GU/RECTAL/BREASTS:  Not done, and not indicated.  LOWER EXTREMITIES:  Without edema.  Pulses are intact distally.  NEUROLOGIC:  Alert and oriented, with no obvious observable motor  defects.   IMPRESSION:  Pulmonary infiltrates of unknown etiology.  These were  absent on November 01, 2007, even though he had had worsening symptoms  over the last one month.  He had obvious bibasilar infiltrates that were  very significant noted on November 07, 2007, and with no change on  November 19, 2007.  He currently has crackles at least a third of the  way up bilaterally on exam today.  There is nothing to indicate a  possible infection, and he really did not see a big improvement with  Avelox.  However, he is noting that his symptoms are starting to subside  slowly.  It is really unclear to me whether this is some type of unusual  infection or whether this is just simply an inflammatory lung issue such  as sarcoid, BOOP, or possibly hypersensitivity pneumonitis.  Since the  patient has not had an x-ray in almost two weeks, I would like to go  ahead and repeat this, understanding that he does still have crackles on  exam.  If he has had significant improvement in his x-ray, then I think  we should just give him some time and see how things go.  If he has had  no change in his x-ray, then we will get back all of the appropriate  blood tests and consider whether or not he needs to have fiberoptic  bronchoscopy.   PLAN:  1. We will check sed rate, angiotensin-converting enzyme, IgE level,      CBC with differential, and also PT/PTT today.  2. Follow up PA and lateral chest x-ray.  3. I will contact the  patient after the above and discuss further      planning.  Barbaraann Share, MD,FCCP  Electronically Signed    [facility]  DD: 12/01/2007  DT: 12/01/2007  Job #: [id number]   cc:   Reather Littler, M.D.

## 2011-05-15 NOTE — Op Note (Signed)
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   OPERATION:  Prostate brachytherapy, cystoscopy.   ANESTHESIA:  General.   Brune:  Courtney Paris, M.D.   ASSISTANT:  Chipper Herb.   BRIEF HISTORY:  This 70 year old patient presents with a T1A 3 +3  adenocarcinoma of the prostate with positive biopsies at the right apex  for brachytherapy.  He had a negative biopsy in 2005.  His PSA was 3.2.  He enters now for definitive treatment.  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   The patient was placed on the operating table in dorsal lithotomy  position.  After satisfactory induction of general anesthesia, he was  prepped and draped with Betadine in the usual sterile fashion.  Time-out  was then performed and the patient and procedure then reconfirmed.   A rectal tube and the 7.5 MHz probe was inserted into the rectum and a  Foley catheter into the bladder.  Dr. Dayton Scrape then proceeded to plan the  treatment with the nuclear physicist.  When this was done, I was called  back to the OR, where again a second time-out was then performed and the  patient and procedure again reconfirmed.  The seeds were then implanted  in the prostate, using ultrasonic guidance.  The total of 28 needles  with 68 seeds were implanted of I125.  The patient tolerated this well  and the post-implant fluoroscopy films looked quite good.   The Foley catheter was then removed.  He was reprepped and draped and a  cysto was then  performed and the anterior urethra was normal.  The post  urethra showed some mild prostate enlargement and the bladder entered.  No seeds were present within the bladder.  The rest of the bladder was  normal.  Scope was then removed, Foley catheter then reinserted and the  patient taken to the recovery room in good condition.  He will be later  discharged as an outpatient and stay on his finasteride and his Flomax  for now.  The catheter will come out in two days.  He will come back to  the office in three weeks for follow-up.     Courtney Paris, M.D.  Electronically Signed    [facility]/MEDQ  D:  05/04/2009  T:  05/04/2009  Job:  [id number]

## 2011-05-15 NOTE — Discharge Summary (Signed)
NAME:  Mark Daugherty, Mark Daugherty               ACCOUNT NO.:  [id number]   MEDICAL RECORD NO.:  [id number]          PATIENT TYPE:  INP   LOCATION:  1514                         FACILITY:  [facility]   PHYSICIAN:  Barbaraann Share, MD,FCCPDATE OF BIRTH:  05-03-39   DATE OF ADMISSION:  01/19/2008  DATE OF DISCHARGE:  01/29/2008                               DISCHARGE SUMMARY   DIAGNOSES AT TIME OF DISCHARGE:  1. Acute respiratory failure secondary to pulmonary infiltrates of      unknown etiology.  2. Diabetes with decreased control secondary high-dose steroids.   PROCEDURE:  Flexible fiberoptic bronchoscopy.   HISTORY OF PRESENT ILLNESS:  Please see dictated H&P at time of  admission.   HOSPITAL COURSE:  The patient was admitted with acute respiratory  failure secondary to pulmonary infiltrates of unknown etiology.  These  did not appear secondary to congestive heart failure, given the  patient's normal echocardiogram prior to admission and also a low brain  natruretic peptide.  The patient was placed on broad-spectrum  antibiotics in the event this represented infection with his known  history of bronchiectasis; however, he continued to have fever and  worsening clinical status.  Ultimately, the patient had worsening acute  respiratory failure requiring high-flow oxygen in order to maintain O2  saturations, and therefore underwent fiberoptic bronchoscopy with  lavage.  This failed to show any significant infection, and the cell  count appeared more inflammatory than anything else.  Due to the  patient's deteriorating condition, he was placed on empiric IV Solu-  Medrol, and his antibiotics were discontinued since they failed to grow  any organisms.  The patient had a dramatic response with resolution of  fevers, as well as improving chest x-ray and O2 saturations.  He was  ultimately transitioned to the regular floor and placed on low-flow  nasal oxygen which he tolerated quite well.  By the day  of discharge, he  was ambulating in the hall with portable oxygen and maintaining O2  saturations even on high-dose prednisone orally.  He has had no further  fever or anything to suggest an acute infection.  The patient will need  to be maintained on prednisone as an outpatient and will be followed  carefully.  The patient did have difficulties with blood sugars during  his hospitalization due to his diabetes and high-dose steroids.  He did  much better once he was started on his home medications and converted  over to oral rather than IV dosing.  By the day of discharge, his blood  sugars were adequate for going home.   DISCHARGE MEDICATIONS:  His discharge medications include his usual home  medications.  His only new medicine will be prednisone 20 mg 3 tablets  each a.m.   FOLLOWUP:  Follow up with will be me next week.  I have provided the  patient with the office number and asked him to make an appointment.   DIET:  Diabetic.   ACTIVITY:  As tolerated.   CONDITION ON DISCHARGE:  Much improved.      Barbaraann Share, MD,FCCP  Electronically Signed  [facility]  D:  01/29/2008  T:  01/29/2008  Job:  [id number]   cc:   Reather Littler, M.D.  Fax: [phone]

## 2011-05-18 ENCOUNTER — Ambulatory Visit
Admission: RE | Admit: 2011-05-18 | Discharge: 2011-05-18 | Disposition: A | Discharge status: Home / Self Care | Payer: Medicare Other | Source: Ambulatory Visit | Attending: Endocrinology | Admitting: Endocrinology

## 2011-05-18 DIAGNOSIS — E041 Nontoxic single thyroid nodule: Secondary | ICD-10-CM

## 2011-05-18 NOTE — Op Note (Signed)
NAME:  Mark Daugherty, Mark Daugherty               ACCOUNT NO.:  [id number]   MEDICAL RECORD NO.:  [id number]          PATIENT TYPE:  AMB   LOCATION:  ENDO                         FACILITY:  [facility]   PHYSICIAN:  James L. Malon Kindle., M.D.DATE OF BIRTH:  1941-08-11   DATE OF PROCEDURE:  10/17/2004  DATE OF DISCHARGE:                                 OPERATIVE REPORT   PROCEDURE:  Colonoscopy.   ENDOSCOPIST:  Llana Aliment. Randa Evens, M.D.   ANESTHESIA:  Fentanyl 90 mcg, Versed 9 mg IV.   SCOPE:  Olympus pediatric colonoscope.   INDICATIONS FOR PROCEDURE:  Colon cancer screening.   DESCRIPTION OF PROCEDURE:  The procedure had been explained to the patient  and consent obtained. With the patient in the left lateral decubitus  position, the cope was inserted and advanced.  The prep was excellent.  Using abdominal pressure, positioning, searches were able to pass the cecum,  ileocecal valve and appendiceal orifice.  The prep was excellent.  The colon  was examined, ascending colon, descending and transverse colon were seen  well. No polyps were seen. The scope was withdrawn through rectum. The  patient tolerated the procedure well.   ASSESSMENT:  Normal screening colonoscopy, V76.51.   PLAN:  Routine post colonoscopy instructions. Recommend checking the stool  yearly.       JLE/MEDQ  D:  10/17/2004  T:  10/17/2004  Job:  [id number]   cc:   Reather Littler, M.D.  [address], [address]  [place]  [place] [address]  Fax: [phone]

## 2011-08-28 ENCOUNTER — Ambulatory Visit: Payer: Medicare Other | Admitting: Pulmonary Disease

## 2011-08-31 ENCOUNTER — Encounter: Payer: Self-pay | Admitting: Cardiology

## 2011-09-07 ENCOUNTER — Ambulatory Visit (INDEPENDENT_AMBULATORY_CARE_PROVIDER_SITE_OTHER)
Admission: RE | Admit: 2011-09-07 | Discharge: 2011-09-07 | Disposition: A | Discharge status: Home / Self Care | Payer: Medicare Other | Source: Ambulatory Visit | Attending: Pulmonary Disease | Admitting: Pulmonary Disease

## 2011-09-07 ENCOUNTER — Encounter: Payer: Self-pay | Admitting: Pulmonary Disease

## 2011-09-07 ENCOUNTER — Ambulatory Visit (INDEPENDENT_AMBULATORY_CARE_PROVIDER_SITE_OTHER): Payer: Medicare Other | Admitting: Pulmonary Disease

## 2011-09-07 VITALS — BP 124/60 | HR 66 | Temp 97.9°F | Ht 75.5 in | Wt 214.4 lb

## 2011-09-07 DIAGNOSIS — J479 Bronchiectasis, uncomplicated: Secondary | ICD-10-CM

## 2011-09-07 DIAGNOSIS — J8409 Other alveolar and parieto-alveolar conditions: Secondary | ICD-10-CM

## 2011-09-07 NOTE — Assessment & Plan Note (Signed)
The pt has not had any issues with pulmonary infections.

## 2011-09-07 NOTE — Patient Instructions (Signed)
Your cxr looks stable today.  Good news Continue to stay as active as you can Will schedule for breathing studies at your convenience in the next 4 weeks, and will call you with results.  followup with me in 6mos.

## 2011-09-07 NOTE — Progress Notes (Signed)
  Subjective:    Patient ID: Mark Daugherty, male    DOB: September 29, 1941, 70 y.o.   MRN: [id number]  HPI The patient comes in today for followup of his lung disease associated with polymyositis.  He has had an alveolar component in the past, but this resolved on Imuran per his rheumatologist.  He has lower lobe chronic interstitial disease which is very mild, and has really not impacted his exertional tolerance.  The patient is staying very active currently, and denies any significant cough or mucus production.  He has not had recent PFTs, and we'll need to get these done to keep up with baseline.   Review of Systems  Constitutional: Negative for fever and unexpected weight change.  HENT: Positive for congestion. Negative for ear pain, nosebleeds, sore throat, rhinorrhea, sneezing, trouble swallowing, dental problem, postnasal drip and sinus pressure.   Eyes: Negative for redness and itching.  Respiratory: Negative for cough, chest tightness, shortness of breath and wheezing.   Cardiovascular: Negative for palpitations and leg swelling.  Gastrointestinal: Negative for nausea and vomiting.  Genitourinary: Negative for dysuria.  Musculoskeletal: Negative for joint swelling.  Skin: Negative for rash.  Neurological: Negative for headaches.  Hematological: Does not bruise/bleed easily.  Psychiatric/Behavioral: Negative for dysphoric mood. The patient is not nervous/anxious.        Objective:   Physical Exam Well-developed male in no acute distress Nose without purulence or discharge noted Chest with minimal basilar crackles, no wheezes or rhonchi Cardiac exam with regular rate and rhythm Lower extremities without edema, no cyanosis noted Alert and oriented, moves all 4 extremities.       Assessment & Plan:

## 2011-09-07 NOTE — Assessment & Plan Note (Signed)
The patient is doing very well from a pulmonary standpoint, and is being treated with Imuran per rheumatology for his polymyositis.  His chest x-ray today shows no change in his basilar interstitial disease.  The patient reports no worsening breathing issues, and is leading a very functional and active lifestyle.  He will need to have pulmonary function studies in order to establish an ongoing baseline.  He will followup with me in 6 months, but call if having issues with his breathing.

## 2011-09-14 ENCOUNTER — Ambulatory Visit (INDEPENDENT_AMBULATORY_CARE_PROVIDER_SITE_OTHER): Payer: Medicare Other | Admitting: Cardiology

## 2011-09-14 ENCOUNTER — Encounter: Payer: Self-pay | Admitting: Cardiology

## 2011-09-14 VITALS — BP 152/60 | HR 60 | Ht 75.0 in | Wt 212.2 lb

## 2011-09-14 DIAGNOSIS — I10 Essential (primary) hypertension: Secondary | ICD-10-CM

## 2011-09-14 DIAGNOSIS — I351 Nonrheumatic aortic (valve) insufficiency: Secondary | ICD-10-CM | POA: Insufficient documentation

## 2011-09-14 DIAGNOSIS — I359 Nonrheumatic aortic valve disorder, unspecified: Secondary | ICD-10-CM

## 2011-09-14 DIAGNOSIS — I712 Thoracic aortic aneurysm, without rupture: Secondary | ICD-10-CM

## 2011-09-14 NOTE — Progress Notes (Signed)
Mark Daugherty Date of Birth: Jun 17, 1941   History of Present Illness: [name] is seen today for followup. He has a history of thoracic aortic aneurysm and chronic aortic insufficiency which is moderate to severe. He has a history of polymyositis. He also has a history of diabetes and prostate cancer. He has been followed here since 2004 and his echocardiograms have been stable. He has no history of congestive heart failure or arrhythmia. He denies any chest pain, shortness of breath, dizziness, or syncope. He has had no edema. His energy level is good.  Current Outpatient Prescriptions on File Prior to Visit  Medication Sig Dispense Refill  . amLODipine-olmesartan (AZOR) 5-40 MG per tablet Take 1 tablet by mouth daily.        Marland Kitchen azathioprine (IMURAN) 100 MG tablet Take 100 mg by mouth daily.        . carvedilol (COREG) 25 MG tablet Take 25 mg by mouth 2 (two) times daily with a meal.        . finasteride (PROSCAR) 5 MG tablet Take 5 mg by mouth daily.        . Folic Acid-Vit B6-Vit B12 (FOLBEE) 2.5-25-1 MG TABS Take 1 tablet by mouth daily.        . Multiple Vitamin (MULTIVITAMIN) tablet Take 1 tablet by mouth daily.        . pioglitazone (ACTOS) 30 MG tablet Take 30 mg by mouth daily.        . sildenafil (VIAGRA) 100 MG tablet Take 100 mg by mouth as needed.        . simvastatin (ZOCOR) 20 MG tablet Take 20 mg by mouth at bedtime.        . sitaGLIPtan-metformin (JANUMET) 50-1000 MG per tablet Take 1 tablet by mouth 2 (two) times daily with a meal.        . Tamsulosin HCl (FLOMAX) 0.4 MG CAPS Take by mouth daily.          Allergies  Allergen Reactions  . Codeine   . Lisinopril   . Sulfonamide Derivatives     Past Medical History  Diagnosis Date  . Aortic insufficiency   . Thoracic aortic aneurysm   . Hypertension   . Polymyositis   . Prostate cancer   . Diabetes mellitus     Past Surgical History  Procedure Date  . Radioactive seed implant   . US echocardiography  12/05/2010    EF 55-65%  . Cardiovascular stress test 06/12/2000    EF 59%    History  Smoking status  . Never Smoker   Smokeless tobacco  . Not on file    History  Alcohol Use No    Family History  Problem Relation Age of Onset  . Stroke Father   . Hypertension Sister   . Diabetes Sister   . Heart attack Brother   . Hypertension Brother     Review of Systems: The review of systems is positive for improvement in his diabetic control. In fact his medication was reduced. He did have recent followup evaluation with Dr. Shelle Iron and chest x-ray showed no change. He is scheduled for pulmonary function studies. He does walk on a treadmill 3 days a week and knows 5 acres of grass. He states he only gets tired when he does a lot of bending over.  All other systems were reviewed and are negative.  Physical Exam: BP 152/60  Pulse 60  Ht 6\' 3"  (1.905 m)  Wt 212 lb  3.2 oz (96.253 kg)  BMI 26.52 kg/m2 He is a pleasant black male in no acute distress. He is normocephalic, atraumatic. Pupils are equal round and reactive to light and accommodation. Extraocular movements are full. Oropharynx is clear. Neck is supple without JVD, adenopathy, thyromegaly, or bruits. Lungs are clear. Cardiac exam reveals a regular rate and rhythm with a grade 2/6 systolic ejection murmur at the right upper sternal border. There is a very soft diastolic murmur. He has no S3. Abdomen is soft and nontender without masses or bruits. He has good pedal pulses. There is no edema. He is alert and oriented x3. Cranial nerves II through XII are intact.  Laboratory data: His last evaluation with echocardiogram was in December of 2011. This showed moderate to severe aortic insufficiency with moderate aortic root enlargement. He had moderate LVH with normal systolic function and ejection fraction estimated 55-65%. Left ventricular chamber size was normal. There was moderate left atrial enlargement. His last CT of the chest in  December of 2011 showed an asymmetry in aortic aneurysm with maximum dimension of 49 mm.   Assessment / Plan:

## 2011-09-14 NOTE — Assessment & Plan Note (Signed)
Maximal dimension is 49 mm. This has been stable for a number of years. He is asymptomatic. I would not consider elective repair until it is over 55 mm.

## 2011-09-14 NOTE — Assessment & Plan Note (Signed)
He has moderate to severe aortic insufficiency. He is asymptomatic. His left ventricular sizes are still normal and stable. We will continue to focus on blood pressure management and followup again in 6 months.

## 2011-09-14 NOTE — Patient Instructions (Signed)
Continue your current medications  I will see you again in 6 months.   

## 2011-09-21 LAB — CBC
HCT: 30.5 — ABNORMAL LOW
HCT: 32.1 — ABNORMAL LOW
HCT: 33.2 — ABNORMAL LOW
HCT: 33.5 — ABNORMAL LOW
HCT: 33.6 — ABNORMAL LOW
HCT: 35.6 — ABNORMAL LOW
Hemoglobin: 10.4 — ABNORMAL LOW
Hemoglobin: 10.8 — ABNORMAL LOW
Hemoglobin: 10.9 — ABNORMAL LOW
Hemoglobin: 11 — ABNORMAL LOW
Hemoglobin: 11 — ABNORMAL LOW
Hemoglobin: 11.2 — ABNORMAL LOW
Hemoglobin: 11.9 — ABNORMAL LOW
MCHC: 33.7
MCHC: 34.1
MCHC: 34.2
MCHC: 34.2
MCV: 86.1
MCV: 86.4
MCV: 86.7
MCV: 86.8
Platelets: 339
Platelets: 345
Platelets: 398
RBC: 3.59 — ABNORMAL LOW
RBC: 3.72 — ABNORMAL LOW
RBC: 3.75 — ABNORMAL LOW
RBC: 3.75 — ABNORMAL LOW
RBC: 3.81 — ABNORMAL LOW
RBC: 3.87 — ABNORMAL LOW
RBC: 3.89 — ABNORMAL LOW
RBC: 4.12 — ABNORMAL LOW
RDW: 14.3
RDW: 14.6
RDW: 14.7
WBC: 10.3
WBC: 11.9 — ABNORMAL HIGH
WBC: 14.1 — ABNORMAL HIGH
WBC: 14.5 — ABNORMAL HIGH
WBC: 16.1 — ABNORMAL HIGH
WBC: 7.4

## 2011-09-21 LAB — URINALYSIS, ROUTINE W REFLEX MICROSCOPIC
Bilirubin Urine: NEGATIVE
Glucose, UA: >1000 — AB
Glucose, UA: NEGATIVE
Ketones, ur: NEGATIVE
Leukocytes, UA: NEGATIVE
Protein, ur: 30 — AB
Specific Gravity, Urine: 1.011
pH: 6
pH: 6

## 2011-09-21 LAB — BASIC METABOLIC PANEL
CO2: 25
Calcium: 8.3 — ABNORMAL LOW
Chloride: 103
GFR calc Af Amer: >60
Glucose, Bld: 188 — ABNORMAL HIGH
Potassium: 3.9
Sodium: 137

## 2011-09-21 LAB — APTT: aPTT: 32

## 2011-09-21 LAB — BLOOD GAS, ARTERIAL
Acid-Base Excess: 1.1
Drawn by: 295031
FIO2: 1
O2 Saturation: 95
pCO2 arterial: 37.8
pO2, Arterial: 78 — ABNORMAL LOW

## 2011-09-21 LAB — PROTIME-INR: INR: 1

## 2011-09-21 LAB — URINE CULTURE
Colony Count: NO GROWTH
Special Requests: NEGATIVE

## 2011-09-21 LAB — CULTURE, RESPIRATORY W GRAM STAIN
Culture: NO GROWTH
Culture: NORMAL

## 2011-09-21 LAB — BODY FLUID CELL COUNT WITH DIFFERENTIAL
Eos, Fluid: 13
Lymphs, Fluid: 10
Monocyte-Macrophage-Serous Fluid: 4 — ABNORMAL LOW
Neutrophil Count, Fluid: 73 — ABNORMAL HIGH
Total Nucleated Cell Count, Fluid: 2000 — ABNORMAL HIGH

## 2011-09-21 LAB — URINE MICROSCOPIC-ADD ON

## 2011-09-21 LAB — CULTURE, BLOOD (ROUTINE X 2): Culture: NO GROWTH

## 2011-09-21 LAB — COMPREHENSIVE METABOLIC PANEL
AST: 89 — ABNORMAL HIGH
Albumin: 2.6 — ABNORMAL LOW
BUN: 6
Calcium: 8.4
Creatinine, Ser: 1
GFR calc Af Amer: >60
Total Bilirubin: 0.6

## 2011-09-21 LAB — LEGIONELLA ANTIGEN, URINE: Legionella Antigen, Urine: NEGATIVE

## 2011-09-21 LAB — P CARINII SMEAR DFA

## 2011-09-21 LAB — AFB CULTURE WITH SMEAR (NOT AT ARMC)

## 2011-09-21 LAB — C4 COMPLEMENT: Complement C4, Body Fluid: 25

## 2011-09-21 LAB — ANTI-NUCLEAR AB-TITER (ANA TITER)

## 2011-09-21 LAB — CREATININE, SERUM
Creatinine, Ser: 0.93
GFR calc non Af Amer: >60

## 2011-09-21 LAB — B-NATRIURETIC PEPTIDE (CONVERTED LAB)
Pro B Natriuretic peptide (BNP): <30
Pro B Natriuretic peptide (BNP): <30

## 2011-09-21 LAB — LEGIONELLA PROFILE(CULTURE+DFA/SMEAR): Legionella Antigen (DFA): NEGATIVE

## 2011-09-21 LAB — RHEUMATOID FACTOR: Rhuematoid fact SerPl-aCnc: <20

## 2011-09-21 LAB — FUNGUS CULTURE W SMEAR: Fungal Smear: NONE SEEN

## 2011-09-21 LAB — SEDIMENTATION RATE: Sed Rate: 54 — ABNORMAL HIGH

## 2011-10-04 ENCOUNTER — Ambulatory Visit: Payer: Medicare Other | Admitting: Pulmonary Disease

## 2011-10-09 ENCOUNTER — Ambulatory Visit (INDEPENDENT_AMBULATORY_CARE_PROVIDER_SITE_OTHER): Payer: Medicare Other | Admitting: Pulmonary Disease

## 2011-10-09 DIAGNOSIS — J8409 Other alveolar and parieto-alveolar conditions: Secondary | ICD-10-CM

## 2011-10-09 DIAGNOSIS — J479 Bronchiectasis, uncomplicated: Secondary | ICD-10-CM

## 2011-10-09 LAB — PULMONARY FUNCTION TEST

## 2011-10-09 NOTE — Progress Notes (Signed)
PFT done today. 

## 2011-10-29 ENCOUNTER — Encounter: Payer: Self-pay | Admitting: Pulmonary Disease

## 2012-02-29 ENCOUNTER — Ambulatory Visit (INDEPENDENT_AMBULATORY_CARE_PROVIDER_SITE_OTHER): Payer: Medicare Other | Admitting: Cardiology

## 2012-02-29 ENCOUNTER — Encounter: Payer: Self-pay | Admitting: Cardiology

## 2012-02-29 VITALS — BP 159/70 | HR 66 | Ht 75.5 in | Wt 212.0 lb

## 2012-02-29 DIAGNOSIS — I359 Nonrheumatic aortic valve disorder, unspecified: Secondary | ICD-10-CM

## 2012-02-29 DIAGNOSIS — I10 Essential (primary) hypertension: Secondary | ICD-10-CM

## 2012-02-29 DIAGNOSIS — I712 Thoracic aortic aneurysm, without rupture: Secondary | ICD-10-CM

## 2012-02-29 DIAGNOSIS — I351 Nonrheumatic aortic (valve) insufficiency: Secondary | ICD-10-CM

## 2012-02-29 NOTE — Patient Instructions (Signed)
Continue current medications.Monitor your blood pressure.  I will see you again 6 months.

## 2012-02-29 NOTE — Assessment & Plan Note (Signed)
He has moderate to severe insufficiency. He is asymptomatic. Left ventricular size and function have been normal. Continue to monitor.

## 2012-02-29 NOTE — Assessment & Plan Note (Signed)
Blood pressure is mildly elevated today. He has followup appointments in the near future Dr. Shelle Iron and Dr. Lucianne Muss. If his blood pressure remains elevated and we may need to have additional therapy. Given his complaints of incontinence I would avoid a diuretic. Hydralazine would be a reasonable add-on if needed.

## 2012-02-29 NOTE — Progress Notes (Signed)
Mark Daugherty Date of Birth: [birth year]   History of Present Illness: Mark Daugherty is seen today for followup. He has a history of thoracic aortic aneurysm and chronic aortic insufficiency which is moderate to severe. He has a history of polymyositis. He also has a history of diabetes and prostate cancer. He has been followed here since 2004 and his echocardiograms and CT scans have been stable. His last evaluation was in December of 2011. He has no history of congestive heart failure or arrhythmia. He denies any chest pain, shortness of breath, dizziness, or syncope. He has had no edema. He has not been exercising as much because his CPK is elevated. He plans to resume his exercise when the weather is warmer.  Current Outpatient Prescriptions on File Prior to Visit  Medication Sig Dispense Refill  . amLODipine-olmesartan (AZOR) 5-40 MG per tablet Take 1 tablet by mouth daily.        Marland Kitchen azathioprine (IMURAN) 100 MG tablet Take 100 mg by mouth daily.        . carvedilol (COREG) 25 MG tablet Take 25 mg by mouth 2 (two) times daily with a meal.        . finasteride (PROSCAR) 5 MG tablet Take 5 mg by mouth daily.        . Folic Acid-Vit B6-Vit B12 (FOLBEE) 2.5-25-1 MG TABS Take 1 tablet by mouth daily.        . Multiple Vitamin (MULTIVITAMIN) tablet Take 1 tablet by mouth daily.        . pioglitazone (ACTOS) 30 MG tablet Take 30 mg by mouth daily.        . sildenafil (VIAGRA) 100 MG tablet Take 100 mg by mouth as needed.        . simvastatin (ZOCOR) 20 MG tablet Take 20 mg by mouth at bedtime.        . sitaGLIPtan-metformin (JANUMET) 50-1000 MG per tablet Take 1 tablet by mouth 2 (two) times daily with a meal.        . Tamsulosin HCl (FLOMAX) 0.4 MG CAPS Take by mouth daily.          Allergies  Allergen Reactions  . Codeine   . Lisinopril   . Sulfonamide Derivatives     Past Medical History  Diagnosis Date  . Aortic insufficiency   . Thoracic aortic aneurysm   . Hypertension   .  Polymyositis   . Prostate cancer   . Diabetes mellitus     Past Surgical History  Procedure Date  . Radioactive seed implant   . US echocardiography 12/05/2010    EF 55-65%  . Cardiovascular stress test 06/12/2000    EF 59%    History  Smoking status  . Never Smoker   Smokeless tobacco  . Not on file    History  Alcohol Use No    Family History  Problem Relation Age of Onset  . Stroke Father   . Hypertension Sister   . Diabetes Sister   . Heart attack Brother   . Hypertension Brother     Review of Systems: As noted in HPI. 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 All other systems were reviewed and are negative.  Physical Exam: BP 159/70  Pulse 66  Ht 6' 3.5" (1.918 m)  Wt 212 lb (96.163 kg)  BMI 26.15 kg/m2 He is a pleasant black male in no acute distress. He is normocephalic, atraumatic. Pupils are equal round and reactive  to light and accommodation. Extraocular movements are full. Oropharynx is clear. Neck is supple without JVD, adenopathy, thyromegaly, or bruits. Lungs are clear. Cardiac exam reveals a regular rate and rhythm with a grade 2/6 systolic ejection murmur at the right upper sternal border. There is a very soft diastolic murmur. He has no S3. Abdomen is soft and nontender without masses or bruits. He has good pedal pulses. There is no edema. He is alert and oriented x3. Cranial nerves II through XII are intact.  Laboratory data: His last evaluation with echocardiogram was in December of 2011. This showed moderate to severe aortic insufficiency with moderate aortic root enlargement. He had moderate LVH with normal systolic function and ejection fraction estimated 55-65%. Left ventricular chamber size was normal. There was moderate left atrial enlargement. His last CT of the chest in December of 2011 showed an asymmetry in aortic aneurysm with maximum dimension of 49 mm. ECG today demonstrates normal sinus rhythm  with LVH and mild repolarization abnormality.  Assessment / Plan:

## 2012-02-29 NOTE — Assessment & Plan Note (Signed)
This has been stable for the past years with dimension measuring between 4.7 and 5.0 cm. He is asymptomatic. We will continue with his medical therapy and followup again in 6 months.

## 2012-03-05 ENCOUNTER — Ambulatory Visit (INDEPENDENT_AMBULATORY_CARE_PROVIDER_SITE_OTHER): Payer: Medicare Other | Admitting: Pulmonary Disease

## 2012-03-05 ENCOUNTER — Encounter: Payer: Self-pay | Admitting: Pulmonary Disease

## 2012-03-05 DIAGNOSIS — J479 Bronchiectasis, uncomplicated: Secondary | ICD-10-CM

## 2012-03-05 DIAGNOSIS — J8409 Other alveolar and parieto-alveolar conditions: Secondary | ICD-10-CM

## 2012-03-05 NOTE — Patient Instructions (Signed)
Continue to stay active Will see you back in 6mos, but call if change in your breathing.

## 2012-03-05 NOTE — Assessment & Plan Note (Signed)
The patient has had no recent pulmonary infections, and currently is not producing mucus.

## 2012-03-05 NOTE — Progress Notes (Signed)
  Subjective:    Patient ID: Mark Daugherty, male    DOB: 05/21/1941, 71 y.o.   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  He has been doing very well from a pulmonary standpoint, and is seen no change in his exertional tolerance.  He also denies cough or purulent mucus.  He is being maintained on immunosuppressive medications by his rheumatologist, and tells me that his muscle disease has been under fairly good control.  He also has known aortic insufficiency and is being followed closely by cardiology.  He has had a recent chest x-ray as well as PFTs that have been stable.   Review of Systems  Constitutional: Negative for fever and unexpected weight change.  HENT: Positive for congestion. Negative for ear pain, nosebleeds, sore throat, rhinorrhea, sneezing, trouble swallowing, dental problem, postnasal drip and sinus pressure.   Eyes: Negative for redness and itching.  Respiratory: Negative for cough, chest tightness, shortness of breath and wheezing.   Cardiovascular: Negative for palpitations and leg swelling.  Gastrointestinal: Negative for nausea and vomiting.  Genitourinary: Negative for dysuria.  Musculoskeletal: Negative for joint swelling.  Skin: Negative for rash.  Neurological: Negative for headaches.  Hematological: Does not bruise/bleed easily.  Psychiatric/Behavioral: Negative for dysphoric mood. The patient is not nervous/anxious.        Objective:   Physical Exam WD male in nad Nose without purulence or discharge Chest with minimal crackles, no wheezing Cor with rrr, 3/6 sem LE without significant edema Alert, oriented, moves all 4.        Assessment & Plan:

## 2012-03-05 NOTE — Assessment & Plan Note (Signed)
The patient has been stable from an interstitial lung disease standpoint.  His polymyositis being well treated by his rheumatologist, and he has seen no change in his pulmonary symptoms.  He continues to be very active.

## 2012-09-09 ENCOUNTER — Encounter: Payer: Self-pay | Admitting: Pulmonary Disease

## 2012-09-09 ENCOUNTER — Ambulatory Visit (INDEPENDENT_AMBULATORY_CARE_PROVIDER_SITE_OTHER): Payer: Medicare Other | Admitting: Pulmonary Disease

## 2012-09-09 VITALS — BP 130/62 | HR 75 | Temp 98.0°F | Ht 75.5 in | Wt 207.6 lb

## 2012-09-09 DIAGNOSIS — J8409 Other alveolar and parieto-alveolar conditions: Secondary | ICD-10-CM

## 2012-09-09 DIAGNOSIS — J479 Bronchiectasis, uncomplicated: Secondary | ICD-10-CM

## 2012-09-09 NOTE — Assessment & Plan Note (Signed)
The patient has been doing well from a polymyositis standpoint on Imuran.  He has not had any change in his exertional tolerance, nor has he had any significant cough.  He is due for his yearly PFTs and chest x-ray, and we'll call him with the results when available.  I will also forward these to his rheumatologist.

## 2012-09-09 NOTE — Progress Notes (Signed)
  Subjective:    Patient ID: Mark Daugherty, male    DOB: 1941/03/23, 71 y.o.   MRN: [id number]  HPI The patient comes in today for followup of his known interstitial disease, probably secondary to polymyositis.  He also has mild bronchiectasis.  He is being treated by rheumatology with Imuran, and has had fairly stable symptoms, chest x-rays, and PFTs over the years.  He currently feels that he is at baseline, and is satisfied with his exertional tolerance.  He denies any significant cough or mucus production.  He has not had any chest infection since the last visit.   Review of Systems  Constitutional: Negative for fever and unexpected weight change.  HENT: Negative for ear pain, nosebleeds, congestion, sore throat, rhinorrhea, sneezing, trouble swallowing, dental problem, postnasal drip and sinus pressure.   Eyes: Negative for redness and itching.  Respiratory: Negative for cough, chest tightness, shortness of breath and wheezing.   Cardiovascular: Negative for palpitations and leg swelling.  Gastrointestinal: Negative for nausea and vomiting.  Genitourinary: Negative for dysuria.  Musculoskeletal: Negative for joint swelling.  Skin: Negative for rash.  Neurological: Negative for headaches.  Hematological: Does not bruise/bleed easily.  Psychiatric/Behavioral: Negative for dysphoric mood. The patient is not nervous/anxious.        Objective:   Physical Exam Well-developed male in no acute distress Nose without purulence or discharge noted Chest with mild basilar crackles, no wheezes or rhonchi Cardiac exam with regular rate and rhythm, 2/6 systolic murmur Lower extremities with no edema, cyanosis Alert and oriented, moves all 4 extremities.       Assessment & Plan:

## 2012-09-09 NOTE — Assessment & Plan Note (Signed)
The patient has had no significant cough or mucus production.  He has not had a bronchiectasis flare since the last visit.

## 2012-09-09 NOTE — Patient Instructions (Addendum)
Will check your yearly cxr today. Will schedule for breathing studies at your convenience in the next 8 weeks. Continue to work on your conditioning, stay active. followup with me in 6mos, but call if something with your breathing changes.

## 2012-09-15 ENCOUNTER — Ambulatory Visit (INDEPENDENT_AMBULATORY_CARE_PROVIDER_SITE_OTHER)
Admission: RE | Admit: 2012-09-15 | Discharge: 2012-09-15 | Disposition: A | Discharge status: Home / Self Care | Payer: Medicare Other | Source: Ambulatory Visit | Attending: Pulmonary Disease | Admitting: Pulmonary Disease

## 2012-09-15 DIAGNOSIS — J8409 Other alveolar and parieto-alveolar conditions: Secondary | ICD-10-CM

## 2012-09-15 DIAGNOSIS — J479 Bronchiectasis, uncomplicated: Secondary | ICD-10-CM

## 2012-10-24 ENCOUNTER — Ambulatory Visit (INDEPENDENT_AMBULATORY_CARE_PROVIDER_SITE_OTHER): Payer: Medicare Other | Admitting: Pulmonary Disease

## 2012-10-24 DIAGNOSIS — J479 Bronchiectasis, uncomplicated: Secondary | ICD-10-CM

## 2012-10-24 LAB — PULMONARY FUNCTION TEST

## 2012-10-24 NOTE — Progress Notes (Signed)
PFT done today. 

## 2012-11-10 ENCOUNTER — Encounter: Payer: Self-pay | Admitting: Cardiology

## 2012-11-10 ENCOUNTER — Ambulatory Visit (INDEPENDENT_AMBULATORY_CARE_PROVIDER_SITE_OTHER): Payer: Medicare Other | Admitting: Cardiology

## 2012-11-10 ENCOUNTER — Other Ambulatory Visit: Payer: Self-pay

## 2012-11-10 ENCOUNTER — Telehealth: Payer: Self-pay | Admitting: Pulmonary Disease

## 2012-11-10 VITALS — BP 150/75 | HR 64 | Ht 75.0 in | Wt 213.0 lb

## 2012-11-10 DIAGNOSIS — I7789 Other specified disorders of arteries and arterioles: Secondary | ICD-10-CM

## 2012-11-10 DIAGNOSIS — I712 Thoracic aortic aneurysm, without rupture: Secondary | ICD-10-CM

## 2012-11-10 DIAGNOSIS — I10 Essential (primary) hypertension: Secondary | ICD-10-CM

## 2012-11-10 DIAGNOSIS — I359 Nonrheumatic aortic valve disorder, unspecified: Secondary | ICD-10-CM

## 2012-11-10 DIAGNOSIS — I351 Nonrheumatic aortic (valve) insufficiency: Secondary | ICD-10-CM

## 2012-11-10 NOTE — Telephone Encounter (Signed)
Please let pt know that his breathing tests do show some decrease in size of lungs, but the ability of oxygen to get from his air sacs into the lung has actually improved.  I do not think there is any appreciable change, and would not change his treatment regimen  Based on this study and cxr.

## 2012-11-10 NOTE — Progress Notes (Signed)
Mark Daugherty Date of Birth: 06-20-1941   History of Present Illness: Mark Daugherty is seen today for followup. He has a history of thoracic aortic aneurysm and chronic aortic insufficiency which is moderate to severe. He has a history of polymyositis. He also has a history of diabetes and prostate cancer. He has been followed here since 2004 and his echocardiograms and CT scans have been stable. His last evaluation was in December of 2011. He reports that he is doing very well. He denies any significant chest pain, shortness of breath, or palpitations. He's had no edema. He did have recent pulmonary function studies done which showed moderate restrictive lung disease and mild reduction in diffusion capacity. The chest x-ray was stable. He reports his diabetes and blood pressure have been under good control.  Current Outpatient Prescriptions on File Prior to Visit  Medication Sig Dispense Refill  . amLODipine-olmesartan (AZOR) 5-40 MG per tablet Take 1 tablet by mouth daily.        Marland Kitchen azathioprine (IMURAN) 100 MG tablet Take 100 mg by mouth daily.        . bimatoprost (LUMIGAN) 0.01 % SOLN 1 drop at bedtime.      . carvedilol (COREG) 25 MG tablet Take 25 mg by mouth 2 (two) times daily with a meal.        . finasteride (PROSCAR) 5 MG tablet Take 5 mg by mouth daily.        . Folic Acid-Vit B6-Vit B12 (FOLBEE) 2.5-25-1 MG TABS Take 1 tablet by mouth daily.        . Multiple Vitamin (MULTIVITAMIN) tablet Take 1 tablet by mouth daily.        . pioglitazone (ACTOS) 30 MG tablet Take 30 mg by mouth daily.        . Pregabalin (LYRICA PO) Take by mouth as needed.      . sildenafil (VIAGRA) 100 MG tablet Take 100 mg by mouth as needed.        . simvastatin (ZOCOR) 20 MG tablet Take 20 mg by mouth at bedtime.        . sitaGLIPtan-metformin (JANUMET) 50-1000 MG per tablet Take 1 tablet by mouth 2 (two) times daily with a meal.        . Tamsulosin HCl (FLOMAX) 0.4 MG CAPS Take by mouth daily.           Allergies  Allergen Reactions  . Codeine   . Lisinopril   . Sulfonamide Derivatives     Past Medical History  Diagnosis Date  . Aortic insufficiency   . Thoracic aortic aneurysm   . Hypertension   . Polymyositis   . Prostate cancer   . Diabetes mellitus     Past Surgical History  Procedure Date  . Radioactive seed implant   . US echocardiography 12/05/2010    EF 55-65%  . Cardiovascular stress test 06/12/2000    EF 59%    History  Smoking status  . Never Smoker   Smokeless tobacco  . Not on file    History  Alcohol Use No    Family History  Problem Relation Age of Onset  . Stroke Father   . Hypertension Sister   . Diabetes Sister   . Heart attack Brother   . Hypertension Brother     Review of Systems: As noted in HPI. 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 He is seeing a urologist in [facility]. He reports that he  doesn't think he can ever be catheterized. All other systems were reviewed and are negative.  Physical Exam: BP 150/75  Pulse 64  Ht 6\' 3"  (1.905 m)  Wt 213 lb (96.616 kg)  BMI 26.62 kg/m2 He is a pleasant black male in no acute distress. He is normocephalic, atraumatic. Pupils are equal round and reactive to light and accommodation. Extraocular movements are full. Oropharynx is clear. Neck is supple without JVD, adenopathy, thyromegaly, or bruits. Lungs are clear. Cardiac exam reveals a regular rate and rhythm with a grade 2/6 systolic ejection murmur at the right upper sternal border. There is a very soft diastolic murmur. He has no S3. Abdomen is soft and nontender without masses or bruits. He has good pedal pulses. There is no edema. He is alert and oriented x3. Cranial nerves II through XII are intact.  Laboratory data:   Assessment / Plan: 1. Thoracic aortic aneurysm. It has been 2 years since his last evaluation. We will schedule him for a CT angiogram.  2. Chronic aortic insufficiency,  moderate to severe. Patient is asymptomatic. We will repeat an echocardiogram and compare with his prior study 2 years ago. Next  3. Hypertension.  4. 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  5. Diabetes mellitus type 2.

## 2012-11-10 NOTE — Telephone Encounter (Signed)
KC, pt had PFT done here on 10/24/12. Please advise results of this, thanks!

## 2012-11-10 NOTE — Telephone Encounter (Signed)
Spoke with pt and notified of results per Dr.Clance. Pt verbalized understanding and denied any questions.  

## 2012-11-10 NOTE — Patient Instructions (Signed)
Continue your current therapy  We will schedule you for an echocardiogram and CT of your aorta  I will see you again in 6 months.

## 2012-11-18 ENCOUNTER — Ambulatory Visit (HOSPITAL_COMMUNITY): Payer: Medicare Other | Attending: Cardiology

## 2012-11-18 ENCOUNTER — Ambulatory Visit (INDEPENDENT_AMBULATORY_CARE_PROVIDER_SITE_OTHER)
Admission: RE | Admit: 2012-11-18 | Discharge: 2012-11-18 | Disposition: A | Discharge status: Home / Self Care | Payer: Medicare Other | Source: Ambulatory Visit | Attending: Cardiology | Admitting: Cardiology

## 2012-11-18 DIAGNOSIS — Z8546 Personal history of malignant neoplasm of prostate: Secondary | ICD-10-CM | POA: Insufficient documentation

## 2012-11-18 DIAGNOSIS — I359 Nonrheumatic aortic valve disorder, unspecified: Secondary | ICD-10-CM

## 2012-11-18 DIAGNOSIS — I351 Nonrheumatic aortic (valve) insufficiency: Secondary | ICD-10-CM

## 2012-11-18 DIAGNOSIS — I10 Essential (primary) hypertension: Secondary | ICD-10-CM

## 2012-11-18 DIAGNOSIS — I712 Thoracic aortic aneurysm, without rupture: Secondary | ICD-10-CM

## 2012-11-18 MED ORDER — IOHEXOL 350 MG/ML SOLN
100.0000 mL | Freq: Once | INTRAVENOUS | Status: AC | PRN
  Administered 2012-11-18: 100 mL via INTRAVENOUS

## 2012-11-18 NOTE — Progress Notes (Signed)
Echocardiogram performed.  

## 2012-11-20 ENCOUNTER — Encounter: Payer: Self-pay | Admitting: Pulmonary Disease

## 2012-12-01 ENCOUNTER — Encounter: Payer: Self-pay | Admitting: Pulmonary Disease

## 2013-02-14 ENCOUNTER — Other Ambulatory Visit: Payer: Self-pay

## 2013-03-10 ENCOUNTER — Ambulatory Visit: Payer: Medicare Other | Admitting: Pulmonary Disease

## 2013-04-01 ENCOUNTER — Encounter: Payer: Self-pay | Admitting: Pulmonary Disease

## 2013-04-01 ENCOUNTER — Ambulatory Visit (INDEPENDENT_AMBULATORY_CARE_PROVIDER_SITE_OTHER): Payer: Medicare PPO | Admitting: Pulmonary Disease

## 2013-04-01 VITALS — BP 130/68 | HR 68 | Temp 97.8°F | Ht 75.0 in | Wt 219.2 lb

## 2013-04-01 DIAGNOSIS — J479 Bronchiectasis, uncomplicated: Secondary | ICD-10-CM

## 2013-04-01 DIAGNOSIS — J8409 Other alveolar and parieto-alveolar conditions: Secondary | ICD-10-CM

## 2013-04-01 NOTE — Progress Notes (Signed)
  Subjective:    Patient ID: Mark Daugherty, male    DOB: 21-Oct-1941, 72 y.o.   MRN: [id number]  HPI The patient comes in today for followup of his known interstitial lung disease probably related to polymyositis.  He is being followed closely by rheumatology, and currently is on Imuran.  He has been stable since the last visit, with no worsening of his exertional tolerance.  He has had no cough or mucus production.  His last PFT in CT at the end of last year showed no significant worsening of his underlying disease.   Review of Systems  Constitutional: Negative for fever and unexpected weight change.  HENT: Negative for ear pain, nosebleeds, congestion, sore throat, rhinorrhea, sneezing, trouble swallowing, dental problem, postnasal drip and sinus pressure.   Eyes: Negative for redness and itching.  Respiratory: Negative for cough, chest tightness, shortness of breath and wheezing.   Cardiovascular: Negative for palpitations and leg swelling.  Gastrointestinal: Negative for nausea and vomiting.  Genitourinary: Negative for dysuria.  Musculoskeletal: Negative for joint swelling.  Skin: Negative for rash.  Neurological: Negative for headaches.  Hematological: Does not bruise/bleed easily.  Psychiatric/Behavioral: Negative for dysphoric mood. The patient is not nervous/anxious.        Objective:   Physical Exam Wd male in nad Nose without purulence or discharge noted. Neck without LN/TMG Chest with minimal basilar crackles, no wheezing. Cor with rrr LE without edema, no cyanosis Alert and oriented, moves all 4.        Assessment & Plan:

## 2013-04-01 NOTE — Patient Instructions (Addendum)
Continue to work on weight loss and conditioning. Would like to see you back in 6mos, and will check breathing studies here before the visit.

## 2013-04-01 NOTE — Assessment & Plan Note (Signed)
The pt has had no recent pulmonary infections or new airway symptoms.

## 2013-04-01 NOTE — Assessment & Plan Note (Addendum)
Secondary to polymyositis.  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 On imuran per rheumatology No biopsy in past CxR 09/2011:  Stable basilar ISLD PFT's 2012:  No obstruction, TLC 4.84 (63%), DLCO 65% Echo 2013:  Normal LV, mod to severe AI, PA pressure estimated 42mm.  CT chest 10/2012:  Minimal increase in IS changes, inflammatory changes RUL.   The pt is doing very well from a pulmonary standpoint, with no change in his exertional tolerance.  His PFT's and CT have been reasonably stable, and no acute exacerbation of his inflammatory process.  He will continue to need yearly PFT's and cxr.  Will also need to keep an eye on his pulmonary pressures as well.

## 2013-06-21 ENCOUNTER — Emergency Department (HOSPITAL_COMMUNITY)
Admission: EM | Admit: 2013-06-21 | Discharge: 2013-06-21 | Disposition: A | Discharge status: Home / Self Care | Payer: Medicare PPO | Attending: Emergency Medicine | Admitting: Emergency Medicine

## 2013-06-21 ENCOUNTER — Encounter (HOSPITAL_COMMUNITY): Payer: Self-pay | Admitting: Nurse Practitioner

## 2013-06-21 DIAGNOSIS — Y939 Activity, unspecified: Secondary | ICD-10-CM | POA: Insufficient documentation

## 2013-06-21 DIAGNOSIS — Z8679 Personal history of other diseases of the circulatory system: Secondary | ICD-10-CM | POA: Insufficient documentation

## 2013-06-21 DIAGNOSIS — S1096XA Insect bite of unspecified part of neck, initial encounter: Secondary | ICD-10-CM | POA: Insufficient documentation

## 2013-06-21 DIAGNOSIS — Z8546 Personal history of malignant neoplasm of prostate: Secondary | ICD-10-CM | POA: Insufficient documentation

## 2013-06-21 DIAGNOSIS — E119 Type 2 diabetes mellitus without complications: Secondary | ICD-10-CM | POA: Insufficient documentation

## 2013-06-21 DIAGNOSIS — Z8739 Personal history of other diseases of the musculoskeletal system and connective tissue: Secondary | ICD-10-CM | POA: Insufficient documentation

## 2013-06-21 DIAGNOSIS — Z7982 Long term (current) use of aspirin: Secondary | ICD-10-CM | POA: Insufficient documentation

## 2013-06-21 DIAGNOSIS — Z79899 Other long term (current) drug therapy: Secondary | ICD-10-CM | POA: Insufficient documentation

## 2013-06-21 DIAGNOSIS — Y929 Unspecified place or not applicable: Secondary | ICD-10-CM | POA: Insufficient documentation

## 2013-06-21 DIAGNOSIS — I10 Essential (primary) hypertension: Secondary | ICD-10-CM | POA: Insufficient documentation

## 2013-06-21 DIAGNOSIS — W57XXXA Bitten or stung by nonvenomous insect and other nonvenomous arthropods, initial encounter: Secondary | ICD-10-CM | POA: Insufficient documentation

## 2013-06-21 NOTE — ED Provider Notes (Signed)
History     CSN: [id number]  Arrival date & time 06/21/13  1325   First MD Initiated Contact with Patient 06/21/13 1345      Chief Complaint  Patient presents with  . Tick Removal    (Consider location/radiation/quality/duration/timing/severity/associated sxs/prior treatment) HPI Comments: Patient presents to the emergency department with chief complaint of tick bite. Its that he found a tick yesterday. He is concerned that the head is still enlodged and the skin. He denies fevers, chills, nausea, vomiting, new arthralgias, or myalgias. He denies any rash. He removed most of the tic, but is concerned about the head still been attached.  The history is provided by the patient. No language interpreter was used.    Past Medical History  Diagnosis Date  . Aortic insufficiency   . Thoracic aortic aneurysm   . Hypertension   . Polymyositis   . Prostate cancer   . Diabetes mellitus     Past Surgical History  Procedure Laterality Date  . Radioactive seed implant    . US echocardiography  12/05/2010    EF 55-65%  . Cardiovascular stress test  06/12/2000    EF 59%    Family History  Problem Relation Age of Onset  . Stroke Father   . Hypertension Sister   . Diabetes Sister   . Heart attack Brother   . Hypertension Brother     History  Substance Use Topics  . Smoking status: Never Smoker   . Smokeless tobacco: Not on file  . Alcohol Use: No      Review of Systems  All other systems reviewed and are negative.    Allergies  Codeine; Lisinopril; and Sulfonamide derivatives  Home Medications   Current Outpatient Rx  Name  Route  Sig  Dispense  Refill  . amLODipine-olmesartan (AZOR) 5-40 MG per tablet   Oral   Take 1 tablet by mouth daily.          Marland Kitchen aspirin 81 MG tablet   Oral   Take 81 mg by mouth daily.         Marland Kitchen azathioprine (IMURAN) 100 MG tablet   Oral   Take 100 mg by mouth daily.           . bimatoprost (LUMIGAN) 0.01 % SOLN      1 drop  at bedtime.         . carvedilol (COREG) 25 MG tablet   Oral   Take 25 mg by mouth 2 (two) times daily with a meal.           . cefdinir (OMNICEF) 300 MG capsule   Oral   Take 300 mg by mouth 2 (two) times daily. For 10 days         . finasteride (PROSCAR) 5 MG tablet   Oral   Take 5 mg by mouth daily.           . Multiple Vitamin (MULTIVITAMIN) tablet   Oral   Take 1 tablet by mouth daily.           . pioglitazone (ACTOS) 30 MG tablet   Oral   Take 30 mg by mouth daily.           . sildenafil (VIAGRA) 100 MG tablet   Oral   Take 100 mg by mouth as needed.           . simvastatin (ZOCOR) 20 MG tablet   Oral   Take 20 mg by mouth at  bedtime.           . sitaGLIPtan-metformin (JANUMET) 50-1000 MG per tablet   Oral   Take 1 tablet by mouth 2 (two) times daily with a meal.             BP 158/68  Pulse 71  Temp(Src) 98.4 F (36.9 C) (Oral)  Resp 20  Ht 6' 3.5" (1.918 m)  Wt 216 lb (97.977 kg)  BMI 26.63 kg/m2  SpO2 97%  Physical Exam  Nursing note and vitals reviewed. Constitutional: He is oriented to person, place, and time. He appears well-developed and well-nourished.  HENT:  Head: Normocephalic and atraumatic.  Eyes: Conjunctivae and EOM are normal.  Neck: Normal range of motion.  Cardiovascular: Normal rate.   Pulmonary/Chest: Effort normal.  Abdominal: He exhibits no distension.  Musculoskeletal: Normal range of motion.  Neurological: He is alert and oriented to person, place, and time.  Skin: Skin is dry.  Skin on the posterior neck is remarkable for a small bite mark, without any signs of local infection or cellulitis.  Psychiatric: He has a normal mood and affect. His behavior is normal. Judgment and thought content normal.    ED Course  FOREIGN BODY REMOVAL Date/Time: 06/21/2013 2:14 PM Performed by: Roxy Horseman Authorized by: Roxy Horseman Consent: Verbal consent obtained. Risks and benefits: risks, benefits and  alternatives were discussed Consent given by: patient Patient understanding: patient states understanding of the procedure being performed Patient consent: the patient's understanding of the procedure matches consent given Procedure consent: procedure consent matches procedure scheduled Relevant documents: relevant documents present and verified Test results: test results available and properly labeled Site marked: the operative site was marked Imaging studies: imaging studies available Required items: required blood products, implants, devices, and special equipment available Patient identity confirmed: verbally with patient Body area: skin General location: head/neck Location details: neck Patient sedated: no Patient restrained: no Patient cooperative: yes Localization method: visualized Removal mechanism: forceps Dressing: antibiotic ointment and dressing applied Tendon involvement: none Depth: subcutaneous Complexity: simple 1 objects recovered. Objects recovered: Tick Post-procedure assessment: foreign body removed Patient tolerance: Patient tolerated the procedure well with no immediate complications.   (including critical care time)  Labs Reviewed - No data to display No results found.   1. Tick bite       MDM  Patient with tick bite. The tic was a [place] tick, and does not carry Lyme disease. The patient denies any fevers, chills, myalgias, or arthralgias. The tic was removed in part yesterday. Patient is stable and ready for discharge        Roxy Horseman, PA-C 06/21/13 1416

## 2013-06-21 NOTE — ED Notes (Signed)
Pt reports he had a tick on his neck that he tried to remove today and part of it is still in his neck.

## 2013-06-23 NOTE — ED Provider Notes (Signed)
Medical screening examination/treatment/procedure(s) were performed by non-physician practitioner and as supervising physician I was immediately available for consultation/collaboration.  Alvino Lechuga, MD 06/23/13 0039 

## 2013-07-10 ENCOUNTER — Other Ambulatory Visit: Payer: Self-pay | Admitting: Endocrinology

## 2013-07-10 ENCOUNTER — Other Ambulatory Visit (INDEPENDENT_AMBULATORY_CARE_PROVIDER_SITE_OTHER): Payer: Medicare PPO

## 2013-07-10 DIAGNOSIS — D696 Thrombocytopenia, unspecified: Secondary | ICD-10-CM

## 2013-07-10 DIAGNOSIS — E119 Type 2 diabetes mellitus without complications: Secondary | ICD-10-CM

## 2013-07-10 LAB — CBC WITH DIFFERENTIAL/PLATELET
Eosinophils Relative: 3.1 % (ref 0.0–5.0)
MCV: 95.6 fl (ref 78.0–100.0)
Monocytes Absolute: 0.6 10*3/uL (ref 0.1–1.0)
Neutrophils Relative %: 67.6 % (ref 43.0–77.0)
Platelets: 159 10*3/uL (ref 150.0–400.0)
WBC: 4.7 10*3/uL (ref 4.5–10.5)

## 2013-07-10 LAB — HEMOGLOBIN A1C: Hgb A1c MFr Bld: 7.2 % — ABNORMAL HIGH (ref 4.6–6.5)

## 2013-07-10 LAB — BASIC METABOLIC PANEL
GFR: 92.87 mL/min (ref >60.00)
Glucose, Bld: 170 mg/dL — ABNORMAL HIGH (ref 70–99)
Potassium: 4.3 mEq/L (ref 3.5–5.1)
Sodium: 137 mEq/L (ref 135–145)

## 2013-07-14 ENCOUNTER — Other Ambulatory Visit: Payer: Self-pay | Admitting: *Deleted

## 2013-07-14 ENCOUNTER — Ambulatory Visit (INDEPENDENT_AMBULATORY_CARE_PROVIDER_SITE_OTHER): Payer: Medicare PPO | Admitting: Endocrinology

## 2013-07-14 ENCOUNTER — Encounter: Payer: Self-pay | Admitting: Endocrinology

## 2013-07-14 VITALS — BP 128/60 | HR 69 | Temp 98.7°F | Resp 14 | Ht 73.0 in | Wt 216.4 lb

## 2013-07-14 DIAGNOSIS — I10 Essential (primary) hypertension: Secondary | ICD-10-CM

## 2013-07-14 DIAGNOSIS — E785 Hyperlipidemia, unspecified: Secondary | ICD-10-CM

## 2013-07-14 DIAGNOSIS — M3320 Polymyositis, organ involvement unspecified: Secondary | ICD-10-CM

## 2013-07-14 MED ORDER — METFORMIN HCL 500 MG PO TABS: ORAL_TABLET | ORAL | Status: DC

## 2013-07-14 MED ORDER — AMLODIPINE-OLMESARTAN 5-40 MG PO TABS: 1.0000 | ORAL_TABLET | Freq: Every day | ORAL | Status: DC

## 2013-07-14 MED ORDER — LIRAGLUTIDE 18 MG/3ML ~~LOC~~ SOPN: 1.2000 mg | PEN_INJECTOR | Freq: Every day | SUBCUTANEOUS | Status: DC

## 2013-07-14 NOTE — Progress Notes (Signed)
Patient ID: Mark Daugherty, male   DOB: [birth year], 72 y.o.   MRN: [id number]  Reason for Appointment: Diabetes follow-up   History of Present Illness   Diagnosis: Type 2 DIABETES MELITUS, date of diagnosis: 1995        Oral hypoglycemic drugs: Janumet and Actos, previously was on Amaryl        Side effects from medications: None Proper timing of medications in relation to meals: Yes.         Monitors blood glucose: Once a day.    Glucometer: One Touch.          Blood Glucose readings: Glucometer download reviewed: readings before breakfast:  Recent range 115-144 and median about 125. Nonfasting recently 88-162 with a median 142 after supper Hypoglycemia frequency: Never.          Meals: 3 meals per day.          Physical activity: exercise: some working outside, no specific program like treadmill recently            HYPERTENSION:  he has had long-standing hypertension which is well controlled. Does monitor periodically at home and most of his readings are about 130-140 systolic and normal diastolic. No lightheadedness  HYPERLIPIDEMIA:         The lipid abnormality consists of  mildly elevated LDL controlled with simvastatin 20 mg.    Appointment on 07/10/2013  Component Date Value Range Status  . WBC 07/10/2013 4.7  4.5 - 10.5 K/uL Final  . RBC 07/10/2013 4.03* 4.22 - 5.81 Mil/uL Final  . Hemoglobin 07/10/2013 12.8* 13.0 - 17.0 g/dL Final  . HCT 57/84/6962 38.6* 39.0 - 52.0 % Final  . MCV 07/10/2013 95.6  78.0 - 100.0 fl Final  . MCHC 07/10/2013 33.1  30.0 - 36.0 g/dL Final  . RDW 95/28/4132 14.5  11.5 - 14.6 % Final  . Platelets 07/10/2013 159.0  150.0 - 400.0 K/uL Final  . Neutrophils Relative % 07/10/2013 67.6  43.0 - 77.0 % Final  . Lymphocytes Relative 07/10/2013 16.7  12.0 - 46.0 % Final  . Monocytes Relative 07/10/2013 12.1* 3.0 - 12.0 % Final  . Eosinophils Relative 07/10/2013 3.1  0.0 - 5.0 % Final  . Basophils Relative 07/10/2013 0.5  0.0 - 3.0 % Final  . Neutro Abs  07/10/2013 3.2  1.4 - 7.7 K/uL Final  . Lymphs Abs 07/10/2013 0.8  0.7 - 4.0 K/uL Final  . Monocytes Absolute 07/10/2013 0.6  0.1 - 1.0 K/uL Final  . Eosinophils Absolute 07/10/2013 0.1  0.0 - 0.7 K/uL Final  . Basophils Absolute 07/10/2013 0.0  0.0 - 0.1 K/uL Final  . Sodium 07/10/2013 137  135 - 145 mEq/L Final  . Potassium 07/10/2013 4.3  3.5 - 5.1 mEq/L Final  . Chloride 07/10/2013 106  96 - 112 mEq/L Final  . CO2 07/10/2013 28  19 - 32 mEq/L Final  . Glucose, Bld 07/10/2013 170* 70 - 99 mg/dL Final  . BUN 44/12/270 12  6 - 23 mg/dL Final  . Creatinine, Ser 07/10/2013 1.0  0.4 - 1.5 mg/dL Final  . Calcium 53/66/4403 9.0  8.4 - 10.5 mg/dL Final  . GFR 47/42/5956 92.87  >60.00 mL/min Final  . Hemoglobin A1C 07/10/2013 7.2* 4.6 - 6.5 % Final   Glycemic Control Guidelines for People with Diabetes:Non Diabetic:  <6%Goal of Therapy: <7%Additional Action Suggested:  >8%       Medication List       This list is accurate as of:  07/14/13  9:04 AM.  Always use your most recent med list.               aspirin 81 MG tablet  Take 81 mg by mouth daily.     azathioprine 100 MG tablet  Commonly known as:  IMURAN  Take 100 mg by mouth daily.     AZOR 5-40 MG per tablet  Generic drug:  amLODipine-olmesartan  Take 1 tablet by mouth daily.     carvedilol 25 MG tablet  Commonly known as:  COREG  Take 25 mg by mouth 2 (two) times daily with a meal.     cefdinir 300 MG capsule  Commonly known as:  OMNICEF  Take 300 mg by mouth 2 (two) times daily. For 10 days     finasteride 5 MG tablet  Commonly known as:  PROSCAR  Take 5 mg by mouth daily.     LUMIGAN 0.01 % Soln  Generic drug:  bimatoprost  1 drop at bedtime.     multivitamin tablet  Take 1 tablet by mouth daily.     ONE TOUCH ULTRA TEST test strip  Generic drug:  glucose blood     pioglitazone 30 MG tablet  Commonly known as:  ACTOS  Take 30 mg by mouth daily.     sildenafil 100 MG tablet  Commonly known as:  VIAGRA   Take 100 mg by mouth as needed.     simvastatin 20 MG tablet  Commonly known as:  ZOCOR  Take 20 mg by mouth at bedtime.     sitaGLIPtan-metformin 50-1000 MG per tablet  Commonly known as:  JANUMET  Take 1 tablet by mouth 2 (two) times daily with a meal.        Allergies:  Allergies  Allergen Reactions  . Codeine   . Lisinopril   . Sulfonamide Derivatives     Past Medical History  Diagnosis Date  . Aortic insufficiency   . Thoracic aortic aneurysm   . Hypertension   . Polymyositis   . Prostate cancer   . Diabetes mellitus     Past Surgical History  Procedure Laterality Date  . Radioactive seed implant    . US echocardiography  12/05/2010    EF 55-65%  . Cardiovascular stress test  06/12/2000    EF 59%    Family History  Problem Relation Age of Onset  . Stroke Father   . Hypertension Sister   . Diabetes Sister   . Heart attack Brother   . Hypertension Brother     Social History:  reports that he has never smoked. He does not have any smokeless tobacco history on file. He reports that he does not drink alcohol or use illicit drugs.  Review of Systems - Cardiovascular ROS: positive for - aortic regurgitation followed by cardiologist   Examination:   BP 128/60  Pulse 69  Temp(Src) 98.7 F (37.1 C)  Resp 14  Ht 6\' 1"  (1.854 m)  Wt 216 lb 6.4 oz (98.158 kg)  BMI 28.56 kg/m2  SpO2 95%  Body mass index is 28.56 kg/(m^2).   Assesment:   1. Diabetes type 2, uncontrolled - 250.02  The patient's diabetes control appears to be somewhat worse since his last visit because of somewhat decreased activity as well as increased snacking. Although his blood sugars are mostly high after supper and fasting his A1c is relatively high at 7.2, usually has been below 7% previously. He is also concerned about his blood sugars  being high although for his age and multiple medical problems he does not need aggressive control. However since he is interested in weight loss as  well as cutting back on increased snacking he is a good candidate for a GLP-1 drug like Victoza. He will switch Januvia to Victoza. Discussed how Victoza works, benefits, timing of injection, site of injection, possible side effects, safety, titration and support program. He was given a sample and showed him how to use the Victoza pen He will use metformin along with Actos when he switches to Victoza  Encouraged him to increase his exercise and also work on caloric intake  2. Hypertension: Well controlled  3. Lipids: Will need followup on the next visit  4. Dental procedure: Since this is a minor procedure it should not be affected by his diabetes control at this time  [facility] 07/14/2013, 9:04 AM

## 2013-07-14 NOTE — Patient Instructions (Addendum)
Change Janumet to Metformin ER 500, 4 daily when Janumet runs out Continue Actos Continue increasing activity for exercise  Start VICTOZA injection with the sample pen once daily at the same time of the day preferably at bedtime.  Dial the dose to 0.6 mg for the first week.  You may  experience nausea in the first few days which usually gets better the After 1 week increase the dose to 1.2mg  daily if no nausea.  You may inject in the stomach, thigh or arm.   You will feel fullness of the stomach with starting the medication and should try to keep portions of food small.    Fasting lab work on the next visit Bring stool Hemoccult for the next visit

## 2013-07-15 DIAGNOSIS — M3320 Polymyositis, organ involvement unspecified: Secondary | ICD-10-CM | POA: Insufficient documentation

## 2013-07-21 ENCOUNTER — Telehealth: Payer: Self-pay | Admitting: Endocrinology

## 2013-07-21 NOTE — Telephone Encounter (Signed)
Not from Victoza, does not do this, to call Dr [place]

## 2013-07-21 NOTE — Telephone Encounter (Signed)
Pt is aware and will call Dr. [place]

## 2013-07-21 NOTE — Telephone Encounter (Signed)
Pt has had symptoms of tachycardia since being on Victoza, Symptoms started a couple of days ago, he's had several instances of this, wants to know if Victoza could cause it, he does not want to stop taking it, but he's never had this happen before.

## 2013-08-05 ENCOUNTER — Other Ambulatory Visit: Payer: Self-pay

## 2013-08-18 ENCOUNTER — Other Ambulatory Visit: Payer: Medicare PPO

## 2013-08-21 ENCOUNTER — Other Ambulatory Visit (INDEPENDENT_AMBULATORY_CARE_PROVIDER_SITE_OTHER): Payer: Medicare PPO

## 2013-08-21 DIAGNOSIS — IMO0001 Reserved for inherently not codable concepts without codable children: Secondary | ICD-10-CM

## 2013-08-21 LAB — COMPREHENSIVE METABOLIC PANEL
Albumin: 3.6 g/dL (ref 3.5–5.2)
Alkaline Phosphatase: 32 U/L — ABNORMAL LOW (ref 39–117)
BUN: 11 mg/dL (ref 6–23)
Glucose, Bld: 96 mg/dL (ref 70–99)
Potassium: 3.8 mEq/L (ref 3.5–5.1)

## 2013-08-21 LAB — URINALYSIS, ROUTINE W REFLEX MICROSCOPIC
Bilirubin Urine: NEGATIVE
Ketones, ur: NEGATIVE
Urine Glucose: NEGATIVE
Urobilinogen, UA: 0.2 (ref 0.0–1.0)

## 2013-08-21 LAB — MICROALBUMIN / CREATININE URINE RATIO
Creatinine,U: 145.5 mg/dL
Microalb, Ur: 1.9 mg/dL (ref 0.0–1.9)

## 2013-08-24 ENCOUNTER — Other Ambulatory Visit: Payer: Self-pay | Admitting: *Deleted

## 2013-08-24 ENCOUNTER — Other Ambulatory Visit (INDEPENDENT_AMBULATORY_CARE_PROVIDER_SITE_OTHER): Payer: Medicare PPO

## 2013-08-24 DIAGNOSIS — D649 Anemia, unspecified: Secondary | ICD-10-CM

## 2013-08-25 ENCOUNTER — Ambulatory Visit (INDEPENDENT_AMBULATORY_CARE_PROVIDER_SITE_OTHER): Payer: Medicare PPO | Admitting: Endocrinology

## 2013-08-25 ENCOUNTER — Encounter: Payer: Self-pay | Admitting: Endocrinology

## 2013-08-25 ENCOUNTER — Other Ambulatory Visit: Payer: Self-pay | Admitting: *Deleted

## 2013-08-25 VITALS — BP 128/64 | HR 69 | Temp 98.2°F | Resp 12 | Ht 75.0 in | Wt 208.6 lb

## 2013-08-25 DIAGNOSIS — I951 Orthostatic hypotension: Secondary | ICD-10-CM

## 2013-08-25 DIAGNOSIS — E785 Hyperlipidemia, unspecified: Secondary | ICD-10-CM

## 2013-08-25 DIAGNOSIS — I10 Essential (primary) hypertension: Secondary | ICD-10-CM

## 2013-08-25 MED ORDER — SIMVASTATIN 20 MG PO TABS: 20.0000 mg | ORAL_TABLET | Freq: Every day | ORAL | Status: DC

## 2013-08-25 MED ORDER — GLUCOSE BLOOD VI STRP: ORAL_STRIP | Status: DC

## 2013-08-25 NOTE — Progress Notes (Signed)
Patient ID: Mark Daugherty, male   DOB: March 05, 1941, 72 y.o.   MRN: [id number]  Reason for Appointment: Diabetes follow-up   History of Present Illness   Diagnosis: Type 2 DIABETES MELITUS, date of diagnosis: 1995         He has been on various combinations of medications for diabetes including Amaryl  and insulin which have been discontinued Because of higher readings at times and weight gain with excessive snacking he was switched from Januvia to the Victoza but he could not provide 1.2 mg because of nausea. Tolerated 0.6 mg His blood sugars are much better and he has no cravings or desire for large portions and also has lost weight  Oral hypoglycemic drugs: metformin and Actos, previously was on Amaryl         Side effects from medications: nausea from 1.2 mg Victoza Proper timing of medications in relation to meals: not relevant         Monitors blood glucose: Once a day.    Glucometer: One Touch.          Blood Glucose readings: Glucometer download reviewed: readings before breakfast: 84-111, nonfasting 72-147, overall medial 102, previously glucose higher Hypoglycemia not present Meals: 3 meals per day.          Physical activity: exercise: some working outside, no specific program like treadmill recently            Lab Results  Component Value Date   HGBA1C 7.2* 07/10/2013    Wt Readings from Last 3 Encounters:  08/25/13 208 lb 9.6 oz (94.62 kg)  07/14/13 216 lb 6.4 oz (98.158 kg)  06/21/13 216 lb (97.977 kg)   HYPERTENSION:  he has had long-standing hypertension which is well controlled. Does monitor periodically at home and recent readings are about 119/62. Currently on a 3 drug regimen without diuretics  He is now complaining of occasional lightheadedness when standing up  HYPERLIPIDEMIA:         The lipid abnormality consists of  mildly elevated LDL controlled with simvastatin 20 mg.    Appointment on 08/24/2013  Component Date Value Range Status  . Fecal Occult Bld  08/24/2013 Negative  Negative Final  Appointment on 08/21/2013  Component Date Value Range Status  . Sodium 08/21/2013 137  135 - 145 mEq/L Final  . Potassium 08/21/2013 3.8  3.5 - 5.1 mEq/L Final  . Chloride 08/21/2013 103  96 - 112 mEq/L Final  . CO2 08/21/2013 28  19 - 32 mEq/L Final  . Glucose, Bld 08/21/2013 96  70 - 99 mg/dL Final  . BUN 78/29/5621 11  6 - 23 mg/dL Final  . Creatinine, Ser 08/21/2013 1.0  0.4 - 1.5 mg/dL Final  . Total Bilirubin 08/21/2013 0.5  0.3 - 1.2 mg/dL Final  . Alkaline Phosphatase 08/21/2013 32* 39 - 117 U/L Final  . AST 08/21/2013 16  0 - 37 U/L Final  . ALT 08/21/2013 10  0 - 53 U/L Final  . Total Protein 08/21/2013 6.9  6.0 - 8.3 g/dL Final  . Albumin 30/86/5784 3.6  3.5 - 5.2 g/dL Final  . Calcium 69/62/9528 8.8  8.4 - 10.5 mg/dL Final  . GFR 41/32/4401 90.76  >60.00 mL/min Final  . Microalb, Ur 08/21/2013 1.9  0.0 - 1.9 mg/dL Final  . Creatinine,U 02/72/5366 145.5   Final  . Microalb Creat Ratio 08/21/2013 1.3  0.0 - 30.0 mg/g Final  . Color, Urine 08/21/2013 LT. YELLOW  Yellow;Lt. Yellow Final  . APPearance  08/21/2013 CLEAR  Clear Final  . Specific Gravity, Urine 08/21/2013 1.010  1.000-1.030 Final  . pH 08/21/2013 7.5  5.0 - 8.0 Final  . Total Protein, Urine 08/21/2013 NEGATIVE  Negative Final  . Urine Glucose 08/21/2013 NEGATIVE  Negative Final  . Ketones, ur 08/21/2013 NEGATIVE  Negative Final  . Bilirubin Urine 08/21/2013 NEGATIVE  Negative Final  . Hgb urine dipstick 08/21/2013 TRACE-INTACT  Negative Final  . Urobilinogen, UA 08/21/2013 0.2  0.0 - 1.0 Final  . Leukocytes, UA 08/21/2013 NEGATIVE  Negative Final  . Nitrite 08/21/2013 NEGATIVE  Negative Final  . WBC, UA 08/21/2013 none seen  0-2/hpf Final  . RBC / HPF 08/21/2013 3-6/hpf  0-2/hpf Final  . Squamous Epithelial / LPF 08/21/2013 Rare(0-4/hpf)  Rare(0-4/hpf) Final      Medication List       This list is accurate as of: 08/25/13  9:37 AM.  Always use your most recent med list.                amLODipine-olmesartan 5-40 MG per tablet  Commonly known as:  AZOR  Take 1 tablet by mouth daily.     aspirin 81 MG tablet  Take 81 mg by mouth daily.     azathioprine 100 MG tablet  Commonly known as:  IMURAN  Take 100 mg by mouth daily.     carvedilol 25 MG tablet  Commonly known as:  COREG  Take 25 mg by mouth 2 (two) times daily with a meal.     chlorhexidine 0.12 % solution  Commonly known as:  PERIDEX     finasteride 5 MG tablet  Commonly known as:  PROSCAR  Take 5 mg by mouth daily.     Liraglutide 18 MG/3ML Sopn  Commonly known as:  VICTOZA  Inject 1.2 mg into the skin daily.     metFORMIN 500 MG tablet  Commonly known as:  GLUCOPHAGE  500 mg. 1 tablet in the am, and 2 tablets at night     multivitamin tablet  Take 1 tablet by mouth daily.     ONE TOUCH ULTRA TEST test strip  Generic drug:  glucose blood     pioglitazone 30 MG tablet  Commonly known as:  ACTOS  Take 30 mg by mouth daily.     sildenafil 100 MG tablet  Commonly known as:  VIAGRA  Take 100 mg by mouth as needed.     simvastatin 20 MG tablet  Commonly known as:  ZOCOR  Take 20 mg by mouth at bedtime.     sitaGLIPtin-metformin 50-1000 MG per tablet  Commonly known as:  JANUMET  Take 1 tablet by mouth 2 (two) times daily with a meal.        Allergies:  Allergies  Allergen Reactions  . Codeine   . Lisinopril   . Sulfonamide Derivatives     Past Medical History  Diagnosis Date  . Aortic insufficiency   . Thoracic aortic aneurysm   . Hypertension   . Polymyositis   . Prostate cancer   . Diabetes mellitus     Past Surgical History  Procedure Laterality Date  . Radioactive seed implant    . US echocardiography  12/05/2010    EF 55-65%  . Cardiovascular stress test  06/12/2000    EF 59%    Family History  Problem Relation Age of Onset  . Stroke Father   . Hypertension Sister   . Diabetes Sister   . Heart attack Brother   .  Hypertension Brother      Social History:  reports that he has never smoked. He does not have any smokeless tobacco history on file. He reports that he does not drink alcohol or use illicit drugs.  Review of Systems - Cardiovascular ROS: positive for - aortic regurgitation followed by cardiologist He has had polymyositis followed by rheumatologist No recent pedal edema No shortness of breath  History of benign prostatic hypertrophy followed by urologist   Examination:   BP 142/62  Pulse 69  Temp(Src) 98.2 F (36.8 C)  Resp 12  Ht 6\' 3"  (1.905 m)  Wt 208 lb 9.6 oz (94.62 kg)  BMI 26.07 kg/m2  SpO2 99%  Body mass index is 26.07 kg/(m^2).   Romberg sign negative No tremor No pedal edema  Assesment:   Diabetes type 2   His blood sugars are significantly better with switching from Januvia to Victoza and he has lost a significant amount of weight also. Some of his weight loss is related to his dental surgery. However he appears to be having nausea with 1.2 mg Victoza which is better after leaving off for a couple of days He will not restart Victoza until the end of the week and try 0.6 mg only until the next visit. Since his renal function is normal he can continue metformin   HYPERTENSION: Blood pressure is also better controlled with use of Victoza and weight loss; he may be getting orthostatic symptoms from lower blood pressure. No other suggestive explanation for his lightheadedness.He Will reduce his carvedilol to half tablet twice a day until seen by cardiologist. He'll call if he still has any orthostasis   Juris Gosnell 08/25/2013, 9:37 AM

## 2013-08-25 NOTE — Patient Instructions (Addendum)
Restart 0.6 Victoza at end of week  Reduce Carvedilol to 1/2 twice daily until BP over 140

## 2013-08-26 ENCOUNTER — Telehealth: Payer: Self-pay | Admitting: Endocrinology

## 2013-08-26 NOTE — Telephone Encounter (Signed)
Pt would like to know the results of his Triglycerides and Cholesterol.

## 2013-08-26 NOTE — Telephone Encounter (Signed)
Message copied by Hermenia Bers on Wed Aug 26, 2013  4:38 PM ------      Message from: Reather Littler      Created: Wed Aug 26, 2013  4:04 PM       Please let patient know that the lab result is normal and no further action needed ------

## 2013-08-26 NOTE — Progress Notes (Signed)
Quick Note:  Please let patient know that the lab result is normal and no further action needed ______ 

## 2013-08-27 ENCOUNTER — Telehealth: Payer: Self-pay | Admitting: *Deleted

## 2013-08-27 NOTE — Telephone Encounter (Signed)
Pt is aware.  

## 2013-08-27 NOTE — Telephone Encounter (Signed)
Message copied by Hermenia Bers on Thu Aug 27, 2013  4:57 PM ------      Message from: Reather Littler      Created: Wed Aug 26, 2013  4:04 PM       Please let patient know that the lab result is normal and no further action needed ------

## 2013-08-27 NOTE — Telephone Encounter (Signed)
Not done this time, will need to do on next visit

## 2013-08-27 NOTE — Telephone Encounter (Signed)
Message copied by Hermenia Bers on Thu Aug 27, 2013  4:59 PM ------      Message from: Mark Daugherty      Created: Wed Aug 26, 2013  4:04 PM       Please let patient know that the lab result is normal and no further action needed ------

## 2013-09-01 ENCOUNTER — Telehealth: Payer: Self-pay | Admitting: Pulmonary Disease

## 2013-09-01 DIAGNOSIS — J479 Bronchiectasis, uncomplicated: Secondary | ICD-10-CM

## 2013-09-01 NOTE — Telephone Encounter (Signed)
He will continue to need yearly PFT's and cxr per [facility] last OV note. I advised the pt of this and CXR ordered. Carron Curie, CMA

## 2013-09-14 ENCOUNTER — Encounter: Payer: Self-pay | Admitting: Cardiology

## 2013-09-14 ENCOUNTER — Ambulatory Visit (INDEPENDENT_AMBULATORY_CARE_PROVIDER_SITE_OTHER): Payer: Medicare PPO | Admitting: Cardiology

## 2013-09-14 VITALS — BP 161/73 | HR 89 | Ht 75.5 in | Wt 206.0 lb

## 2013-09-14 DIAGNOSIS — E785 Hyperlipidemia, unspecified: Secondary | ICD-10-CM

## 2013-09-14 DIAGNOSIS — I10 Essential (primary) hypertension: Secondary | ICD-10-CM

## 2013-09-14 DIAGNOSIS — I359 Nonrheumatic aortic valve disorder, unspecified: Secondary | ICD-10-CM

## 2013-09-14 DIAGNOSIS — I351 Nonrheumatic aortic (valve) insufficiency: Secondary | ICD-10-CM

## 2013-09-14 DIAGNOSIS — I712 Thoracic aortic aneurysm, without rupture: Secondary | ICD-10-CM

## 2013-09-14 NOTE — Progress Notes (Signed)
Mark Daugherty Date of Birth: [birth year]   History of Present Illness: Mark Daugherty is seen today for followup. He has a history of thoracic aortic aneurysm and chronic aortic insufficiency which is moderate to severe. He has a history of polymyositis. He also has a history of diabetes and prostate cancer. He has been followed here since 2004 and his echocardiograms and CT scans have been stable. His last evaluation was in November 2013. He reports that he is doing very well. He denies any significant chest pain, shortness of breath, or palpitations. He's had no edema.  He has had some difficulty controlling his blood sugars and is now on Victoza. He was experiencing some orthostatic dizziness which resolved when he reduced his Coreg dose in the morning to one half tablet.  Current Outpatient Prescriptions on File Prior to Visit  Medication Sig Dispense Refill  . amLODipine-olmesartan (AZOR) 5-40 MG per tablet Take 1 tablet by mouth daily.  120 tablet  6  . aspirin 81 MG tablet Take 81 mg by mouth daily.      Marland Kitchen azathioprine (IMURAN) 100 MG tablet Take 100 mg by mouth daily.        . carvedilol (COREG) 25 MG tablet Take 25 mg by mouth 2 (two) times daily with a meal.        . chlorhexidine (PERIDEX) 0.12 % solution       . finasteride (PROSCAR) 5 MG tablet Take 5 mg by mouth daily.        Marland Kitchen glucose blood (ONE TOUCH ULTRA TEST) test strip Use to check blood sugars 3 times per day  300 each  3  . Liraglutide (VICTOZA) 18 MG/3ML SOPN Inject 1.2 mg into the skin daily.  2 pen  5  . metFORMIN (GLUCOPHAGE) 500 MG tablet 500 mg. 1 tablet in the am, and 2 tablets at night      . Multiple Vitamin (MULTIVITAMIN) tablet Take 1 tablet by mouth daily.        . pioglitazone (ACTOS) 30 MG tablet Take 30 mg by mouth daily.        . sildenafil (VIAGRA) 100 MG tablet Take 100 mg by mouth as needed.        . simvastatin (ZOCOR) 20 MG tablet Take 1 tablet (20 mg total) by mouth at bedtime.  90 tablet  3   No  current facility-administered medications on file prior to visit.    Allergies  Allergen Reactions  . Codeine   . Lisinopril   . Sulfonamide Derivatives     Past Medical History  Diagnosis Date  . Aortic insufficiency   . Thoracic aortic aneurysm   . Hypertension   . Polymyositis   . Prostate cancer   . Diabetes mellitus     Past Surgical History  Procedure Laterality Date  . Radioactive seed implant    . US echocardiography  12/05/2010    EF 55-65%  . Cardiovascular stress test  06/12/2000    EF 59%    History  Smoking status  . Never Smoker   Smokeless tobacco  . Not on file    History  Alcohol Use No    Family History  Problem Relation Age of Onset  . Stroke Father   . Hypertension Sister   . Diabetes Sister   . Heart attack Brother   . Hypertension Brother     Review of Systems: As noted in HPI.  All other systems were reviewed and are negative.  Physical  Exam: BP 161/73  Pulse 89  Ht 6' 3.5" (1.918 m)  Wt 206 lb (93.441 kg)  BMI 25.4 kg/m2 He is a pleasant black male in no acute distress. He is normocephalic, atraumatic. Pupils are equal round and reactive to light and accommodation. Extraocular movements are full. Oropharynx is clear. Neck is supple without JVD, adenopathy, thyromegaly, or bruits. Lungs are clear. Cardiac exam reveals a regular rate and rhythm with a grade 2/6 systolic ejection murmur at the right upper sternal border. There is a very soft diastolic murmur. He has no S3. Abdomen is soft and nontender without masses or bruits. He has good pedal pulses. There is no edema. He is alert and oriented x3. Cranial nerves II through XII are intact.  Laboratory data:  [name], MD 11/18/2012        Narrative       *RADIOLOGY REPORT*  Clinical Data: Follow up of known thoracic aortic aneurysm  CT ANGIOGRAPHY CHEST  Technique: Multidetector CT imaging of the chest using the standard protocol during bolus administration of  intravenous contrast. Multiplanar reconstructed images including MIPs were obtained and reviewed to evaluate the vascular anatomy.  Contrast: OMNIPAQUE IOHEXOL 350 MG/ML SOLN  Comparison: CT chest of 12/05/2010  Findings: There are primarily interstitial opacities at the lung bases with some mild bronchiectatic change consistent with interstitial lung disease. An active component is difficult to exclude. There also are slightly more prominent interstitial markings throughout the remainder of the lungs. There are a few small nodular opacities within the right upper lobe which are new and continued follow-up is recommended. No pleural effusion is seen. An element of interstitial edema is difficult to exclude.  On soft tissue window images, the thyroid gland is unremarkable. Fusiform dilatation of the thoracic aorta is relatively stable measuring 5.0 x 4.5 cm compared to 4.9 x 4.8 cm previously. No acute thoracic abnormality is evident. The aortic root also is dilated measuring up to 4.6 cm. The thoracic arch is normal in caliber as is the descending thoracic aorta. Cardiomegaly is stable. The pulmonary arteries opacify with no acute abnormality noted. The upper abdomen is unremarkable.  IMPRESSION:  1. Increase in basilar interstitial opacities with some bronchiectatic change most consistent with chronic interstitial lung disease. An active component is difficult to exclude. 2. Small nodular and peribronchovascular opacities in the right upper lobe which appear new. Recommend continued follow-up CT chest in 4-6 months. 3. Cannot exclude an element of mild interstitial edema. 4. Relatively stable fusiform dilatation of the ascending aorta with maximal diameter of 5.0 cm compared to 4.9 cm previously. Cardiomegaly.   Original Report Authenticated By: Dwyane Dee, M.D.       Echo:Study Conclusions  - Left ventricle: The cavity size was normal. Wall thickness was  increased in a pattern of mild LVH. The estimated ejection fraction was 55%. Wall motion was normal; there were no regional wall motion abnormalities. Doppler parameters are consistent with abnormal left ventricular relaxation (grade 1 diastolic dysfunction). Internal dimension:25mm (ED, chordal level, PLAX). - Aortic valve: There was no stenosis. Moderate to severe regurgitation. Regurgitation pressure half-time: . - Aorta: Dilated aortic root and ascending aorta. Visualized ascending aorta measured 4.9 cm. - Mitral valve: Mild regurgitation. - Left atrium: The atrium was mildly dilated. - Right ventricle: The cavity size was normal. Systolic function was normal. - Tricuspid valve: Peak RV-RA gradient: 37mm Hg (S). - Pulmonary arteries: PA peak pressure: 42mm Hg (S). - Inferior vena cava: The vessel was  normal in size; the respirophasic diameter changes were in the normal range (= 50%); findings are consistent with normal central venous pressure. Impressions:  - Normal LV size with mild LV hypertrophy, EF 55%. The aortic valve was trileaflet with no stenosis and moderate to severe regurgitation. The aortic root and ascending aorta wereaneurysmal, see above for dimensions. Normal RV size and systolic function with mild pulmonary hypertension.  ECG today demonstrates normal sinus rhythm with LVH by voltage. Assessment / Plan: 1. Thoracic aortic aneurysm. 5.0 cm. Size is stable by CT. He is asymptomatic.  2. Chronic aortic insufficiency, moderate to severe. Patient is asymptomatic. Echo shows good LV function and left ventricular size.  3. Hypertension. Some recent orthostatic symptoms resolved with reduction in his morning beta blocker.  4. 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  5. Diabetes mellitus type 2.

## 2013-09-14 NOTE — Patient Instructions (Signed)
Continue your current therapy  I will see you in 6 months.   

## 2013-09-28 ENCOUNTER — Telehealth: Payer: Self-pay | Admitting: Endocrinology

## 2013-09-28 MED ORDER — PIOGLITAZONE HCL 30 MG PO TABS: 30.0000 mg | ORAL_TABLET | Freq: Every day | ORAL | Status: DC

## 2013-09-28 NOTE — Telephone Encounter (Signed)
Please call patient regarding Actos refill, called in at pharmacy 09/23/13. Please call in refill  / Sherri

## 2013-09-29 ENCOUNTER — Ambulatory Visit: Payer: Medicare PPO | Admitting: Nurse Practitioner

## 2013-09-29 ENCOUNTER — Telehealth: Payer: Self-pay | Admitting: *Deleted

## 2013-09-29 NOTE — Telephone Encounter (Signed)
Pt is coming in this week for labs, he requests that you check his cholesterol this time since it was not done last time as well as his CK?

## 2013-09-30 ENCOUNTER — Other Ambulatory Visit: Payer: Self-pay | Admitting: *Deleted

## 2013-09-30 NOTE — Telephone Encounter (Signed)
Lipid panel is okay

## 2013-10-02 ENCOUNTER — Other Ambulatory Visit: Payer: Medicare PPO

## 2013-10-06 ENCOUNTER — Other Ambulatory Visit: Payer: Self-pay | Admitting: *Deleted

## 2013-10-06 ENCOUNTER — Ambulatory Visit (INDEPENDENT_AMBULATORY_CARE_PROVIDER_SITE_OTHER): Payer: Medicare PPO | Admitting: Endocrinology

## 2013-10-06 ENCOUNTER — Encounter: Payer: Self-pay | Admitting: Endocrinology

## 2013-10-06 VITALS — BP 126/62 | HR 80 | Temp 98.3°F | Resp 12 | Ht 75.0 in | Wt 206.2 lb

## 2013-10-06 DIAGNOSIS — Z23 Encounter for immunization: Secondary | ICD-10-CM

## 2013-10-06 DIAGNOSIS — I10 Essential (primary) hypertension: Secondary | ICD-10-CM

## 2013-10-06 DIAGNOSIS — M3320 Polymyositis, organ involvement unspecified: Secondary | ICD-10-CM

## 2013-10-06 DIAGNOSIS — IMO0001 Reserved for inherently not codable concepts without codable children: Secondary | ICD-10-CM

## 2013-10-06 MED ORDER — CARVEDILOL 25 MG PO TABS: 25.0000 mg | ORAL_TABLET | Freq: Two times a day (BID) | ORAL | Status: DC

## 2013-10-06 NOTE — Patient Instructions (Addendum)
Same meds

## 2013-10-06 NOTE — Progress Notes (Signed)
Patient ID: Mark Daugherty, male   DOB: 07-26-1941, 72 y.o.   MRN: [id number]  Reason for Appointment: Diabetes follow-up   History of Present Illness   Diagnosis: Type 2 DIABETES MELITUS, date of diagnosis: 1995         He has been on various combinations of medications for diabetes including Amaryl  and insulin which have been discontinued Because of higher readings at times and weight gain with excessive snacking he was switched from Januvia to the Victoza but he could not provide 1.2 mg because of nausea. He has been tolerating 0.6 mg since his last visit with only mild transient nausea His blood sugars are much better and he has no cravings or desire for large portions and also has lost another 2 pounds  Oral hypoglycemic drugs: metformin and Actos, previously was on Amaryl         Side effects from medications: nausea from 1.2 mg Victoza Proper timing of medications in relation to meals: not relevant         Monitors blood glucose: Once a day.    Glucometer: One Touch.          Blood Glucose readings: Glucometer download reviewed: readings before breakfast: 97-131, nonfasting 79-173 with only sporadic readings over 140 and highest reading 129 at bedtime Hypoglycemia not present Meals: 3 meals per day.          Physical activity: exercise: some working outside, no specific program currently            Lab Results  Component Value Date   HGBA1C 7.2* 07/10/2013    Wt Readings from Last 3 Encounters:  10/06/13 206 lb 3.2 oz (93.532 kg)  09/14/13 206 lb (93.441 kg)  08/25/13 208 lb 9.6 oz (94.62 kg)   HYPERTENSION:  he has had long-standing hypertension which is well controlled. On his last visit he was feeling lightheaded on standing up. His Coreg was reduced to half a tablet and he has had less lightheadedness now. Blood pressure is fairly good today without orthostasis although it was high with his cardiologist Does monitor periodically at home and recent readings are about 110-120  systolic. Currently on a 3 drug regimen without diuretics    No visits with results within 1 Week(s) from this visit. Latest known visit with results is:  Appointment on 08/24/2013  Component Date Value Range Status  . Fecal Occult Bld 08/24/2013 Negative  Negative Final      Medication List       This list is accurate as of: 10/06/13  8:26 AM.  Always use your most recent med list.               amLODipine-olmesartan 5-40 MG per tablet  Commonly known as:  AZOR  Take 1 tablet by mouth daily.     aspirin 81 MG tablet  Take 81 mg by mouth daily.     azathioprine 100 MG tablet  Commonly known as:  IMURAN  Take 100 mg by mouth daily.     carvedilol 25 MG tablet  Commonly known as:  COREG  Take 1 tablet (25 mg total) by mouth 2 (two) times daily with a meal.     chlorhexidine 0.12 % solution  Commonly known as:  PERIDEX     finasteride 5 MG tablet  Commonly known as:  PROSCAR  Take 5 mg by mouth daily.     glucose blood test strip  Commonly known as:  ONE TOUCH ULTRA TEST  Use to check blood sugars 3 times per day     Liraglutide 18 MG/3ML Sopn  Inject 0.6 mg into the skin daily.     metFORMIN 500 MG tablet  Commonly known as:  GLUCOPHAGE  500 mg. 1 tablet in the am, and 2 tablets at night     multivitamin tablet  Take 1 tablet by mouth daily.     pioglitazone 30 MG tablet  Commonly known as:  ACTOS  Take 1 tablet (30 mg total) by mouth daily.     sildenafil 100 MG tablet  Commonly known as:  VIAGRA  Take 100 mg by mouth as needed.     simvastatin 20 MG tablet  Commonly known as:  ZOCOR  Take 1 tablet (20 mg total) by mouth at bedtime.        Allergies:  Allergies  Allergen Reactions  . Codeine   . Lisinopril   . Sulfonamide Derivatives     Past Medical History  Diagnosis Date  . Aortic insufficiency   . Thoracic aortic aneurysm   . Hypertension   . Polymyositis   . Prostate cancer   . Diabetes mellitus     Past Surgical History   Procedure Laterality Date  . Radioactive seed implant    . US echocardiography  12/05/2010    EF 55-65%  . Cardiovascular stress test  06/12/2000    EF 59%    Family History  Problem Relation Age of Onset  . Stroke Father   . Hypertension Sister   . Diabetes Sister   . Heart attack Brother   . Hypertension Brother     Social History:  reports that he has never smoked. He does not have any smokeless tobacco history on file. He reports that he does not drink alcohol or use illicit drugs.  Review of Systems - Cardiovascular ROS: positive for - aortic regurgitation followed by cardiologist He has had polymyositis followed by rheumatologist No recent pedal edema HYPERLIPIDEMIA:         The lipid abnormality consists of  mildly elevated LDL controlled with simvastatin 20 mg.   History of benign prostatic hypertrophy followed by urologist    Examination:   BP 130/56  Pulse 80  Temp(Src) 98.3 F (36.8 C)  Resp 12  Ht 6\' 3"  (1.905 m)  Wt 206 lb 3.2 oz (93.532 kg)  BMI 25.77 kg/m2  SpO2 97%  Body mass index is 25.77 kg/(m^2).    Assesment:   HYPERTENSION: Blood pressure is quite good even with reducing his carvedilol and he has less orthostatic symptoms. May consider reducing his Benicar further if blood pressure is lower again Meanwhile he will continue to monitor at home  Diabetes type 2   His blood sugars are overall well controlled even with 0.6 mg Victoza which  he is tolerating well.   Since his renal function is normal he can continue metformin unchanged Has been very compliant with checking his blood sugar about twice a day  Bert Givans 10/06/2013, 8:26 AM

## 2013-10-07 ENCOUNTER — Ambulatory Visit (INDEPENDENT_AMBULATORY_CARE_PROVIDER_SITE_OTHER)
Admission: RE | Admit: 2013-10-07 | Discharge: 2013-10-07 | Disposition: A | Discharge status: Home / Self Care | Payer: Medicare PPO | Source: Ambulatory Visit | Attending: Pulmonary Disease | Admitting: Pulmonary Disease

## 2013-10-07 ENCOUNTER — Encounter: Payer: Self-pay | Admitting: Pulmonary Disease

## 2013-10-07 ENCOUNTER — Ambulatory Visit (INDEPENDENT_AMBULATORY_CARE_PROVIDER_SITE_OTHER): Payer: Medicare PPO | Admitting: Internal Medicine

## 2013-10-07 ENCOUNTER — Ambulatory Visit (INDEPENDENT_AMBULATORY_CARE_PROVIDER_SITE_OTHER): Payer: Medicare PPO | Admitting: Pulmonary Disease

## 2013-10-07 VITALS — BP 158/70 | HR 77 | Temp 98.2°F | Ht 72.0 in | Wt 204.0 lb

## 2013-10-07 DIAGNOSIS — J849 Interstitial pulmonary disease, unspecified: Secondary | ICD-10-CM

## 2013-10-07 DIAGNOSIS — J479 Bronchiectasis, uncomplicated: Secondary | ICD-10-CM

## 2013-10-07 DIAGNOSIS — J8410 Pulmonary fibrosis, unspecified: Secondary | ICD-10-CM

## 2013-10-07 LAB — PULMONARY FUNCTION TEST

## 2013-10-07 NOTE — Assessment & Plan Note (Signed)
The patient appears to be stable from a pulmonary standpoint.  He has not had a significant change in his pulmonary function studies, and his chest x-ray appears stable as well.  The patient feels that he is stable from an exertional tolerance standpoint.  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

## 2013-10-07 NOTE — Patient Instructions (Signed)
No change in your pulmonary status.  Good news. Keep up with a conditioning program followup with me in 6mos, but call if having increased breathing symptoms.

## 2013-10-07 NOTE — Progress Notes (Signed)
  Subjective:    Patient ID: Mark Daugherty, male    DOB: [birth year], 71 y.o.   MRN: [id number]  HPI The patient comes in today for followup of his interstitial lung disease, probably secondary to polymyositis.  He has been on Imuran under the direction of rheumatology, and has done very well from a pulmonary standpoint.  He tells me that his exertional tolerance is completely stable, and denies any significant cough.  His chest x-ray today shows no worsening of his interstitial process, and his PFTs are stable as well.   Review of Systems  Constitutional: Negative for fever and unexpected weight change.  HENT: Negative for congestion, dental problem, ear pain, nosebleeds, postnasal drip, rhinorrhea, sinus pressure, sneezing, sore throat and trouble swallowing.   Eyes: Negative for redness and itching.  Respiratory: Negative for cough, chest tightness, shortness of breath and wheezing.   Cardiovascular: Negative for palpitations and leg swelling.  Gastrointestinal: Negative for nausea and vomiting.  Genitourinary: Negative for dysuria.  Musculoskeletal: Negative for joint swelling.  Skin: Negative for rash.  Neurological: Negative for headaches.  Hematological: Does not bruise/bleed easily.  Psychiatric/Behavioral: Negative for dysphoric mood. The patient is not nervous/anxious.        Objective:   Physical Exam        Assessment & Plan:

## 2013-10-07 NOTE — Progress Notes (Signed)
PFT done today. 

## 2013-10-20 ENCOUNTER — Telehealth: Payer: Self-pay | Admitting: Endocrinology

## 2013-10-20 ENCOUNTER — Other Ambulatory Visit: Payer: Self-pay | Admitting: *Deleted

## 2013-10-20 MED ORDER — SILDENAFIL CITRATE 100 MG PO TABS: 100.0000 mg | ORAL_TABLET | ORAL | Status: DC | PRN

## 2013-10-20 NOTE — Telephone Encounter (Signed)
rx sent

## 2013-10-29 ENCOUNTER — Telehealth: Payer: Self-pay | Admitting: Endocrinology

## 2013-10-29 ENCOUNTER — Telehealth: Payer: Self-pay | Admitting: *Deleted

## 2013-10-29 NOTE — Telephone Encounter (Signed)
Patient woke up this morning with a fever 100.5 and saw his urologist, he thought he had a kidney infection but they said he had none.  He has mild nasal congestion, no cough, wants to know what to do? Should he come in for blood work and an OV.

## 2013-10-29 NOTE — Telephone Encounter (Signed)
Sounds like a viral respiratory infection, continue to watch this until tomorrow and may take Tylenol as needed. Call if having any other symptoms including higher temperature or shaking chills, cough or rash

## 2013-10-29 NOTE — Telephone Encounter (Signed)
Patient was given directions, he will call back if symptoms are worse

## 2013-10-30 ENCOUNTER — Encounter: Payer: Self-pay | Admitting: Pulmonary Disease

## 2013-11-02 ENCOUNTER — Telehealth: Payer: Self-pay | Admitting: Pulmonary Disease

## 2013-11-02 NOTE — Telephone Encounter (Signed)
I spoke with pt. He has had a cough and temp for couple days. He wanted to know what he could take until his appt tomorrow. I advised pt he could take tylenol/ibuprofen for temp, he can get OTC mucinex, delsym or robitussin. He reports he will send his wife out to get this. He will see [place] tomorrow for his appt. Will forward to [facility] as an [place]

## 2013-11-03 ENCOUNTER — Encounter: Payer: Self-pay | Admitting: Pulmonary Disease

## 2013-11-03 ENCOUNTER — Ambulatory Visit (INDEPENDENT_AMBULATORY_CARE_PROVIDER_SITE_OTHER): Payer: Medicare PPO | Admitting: Pulmonary Disease

## 2013-11-03 VITALS — BP 146/62 | HR 82 | Temp 99.0°F | Ht 72.0 in | Wt 210.2 lb

## 2013-11-03 DIAGNOSIS — J019 Acute sinusitis, unspecified: Secondary | ICD-10-CM | POA: Insufficient documentation

## 2013-11-03 DIAGNOSIS — J8410 Pulmonary fibrosis, unspecified: Secondary | ICD-10-CM

## 2013-11-03 DIAGNOSIS — J849 Interstitial pulmonary disease, unspecified: Secondary | ICD-10-CM

## 2013-11-03 MED ORDER — AMOXICILLIN-POT CLAVULANATE 875-125 MG PO TABS: 1.0000 | ORAL_TABLET | Freq: Two times a day (BID) | ORAL | Status: DC

## 2013-11-03 NOTE — Assessment & Plan Note (Signed)
The patient's history is most consistent with an acute sinusitis.  He has purulent nasal discharge, sinus congestion, and a nasal voice.  His lungs are clear except for his usual basilar crackles, and he has recently seen his urologist who does not feel that he has a UTI.  He has had recent failed dental implants, and I have asked him to call his oral [place] to make sure that these may not be complicating his course.  I remained concerned because of his immunosuppressive regimen.

## 2013-11-03 NOTE — Patient Instructions (Signed)
Start on sinus rinses am and pm for the next 7 days, then as needed.  Will start augmentin 875mg  one in am and pm for 10 days.  Take on full stomach with large glass of water. Please call your oral [place]/dentist, and let them know about this issue. Keep scheduled followup with me, but let me know if you are not getting better.

## 2013-11-03 NOTE — Progress Notes (Signed)
  Subjective:    Patient ID: Mark Daugherty, male    DOB: Jun 01, 1941, 72 y.o.   MRN: [id number]  HPI The patient comes in today for an acute sick visit.  He has known interstitial lung disease related to polymyositis, and is on Imuran for this.  He gives a 5 day history of fever to 100 and on, as well as a nasal voice and cough with faintly discolored mucus.  He does not have definite chest congestion, but does get winded with prolonged conversation.  He is having sinus congestion and nasal discharge.  He has seen his urologist who does not feel that he has a urinary tract infection.   Review of Systems  Constitutional: Positive for fever, chills, diaphoresis and fatigue. Negative for unexpected weight change.  HENT: Positive for congestion and voice change. Negative for dental problem, ear pain, nosebleeds, postnasal drip, rhinorrhea, sinus pressure, sneezing, sore throat and trouble swallowing.   Eyes: Negative for redness and itching.  Respiratory: Positive for cough and shortness of breath. Negative for chest tightness and wheezing.   Cardiovascular: Negative for palpitations and leg swelling.  Gastrointestinal: Negative for nausea and vomiting.  Genitourinary: Negative for dysuria.  Musculoskeletal: Negative for joint swelling.  Skin: Negative for rash.  Neurological: Positive for headaches.  Hematological: Does not bruise/bleed easily.  Psychiatric/Behavioral: Negative for dysphoric mood. The patient is not nervous/anxious.        Objective:   Physical Exam Well-developed male in no acute distress Nose with crusted purulence and blood in both nostrils, nasal voice noted. Oropharynx clear, no definite tooth abscess or exudates. Neck without lymphadenopathy or thyromegaly Chest with crackles one third of the way up bilaterally which is chronic, no active wheezing Cardiac exam was regular rate and rhythm Lower extremities with minimal edema, no cyanosis Alert and oriented, moves all  4 extremities.       Assessment & Plan:

## 2013-11-06 ENCOUNTER — Telehealth: Payer: Self-pay | Admitting: Pulmonary Disease

## 2013-11-06 MED ORDER — CEFDINIR 300 MG PO CAPS: ORAL_CAPSULE | ORAL | Status: DC

## 2013-11-06 NOTE — Telephone Encounter (Signed)
Ok to call in Obert 300mg , take 2 each am for same number of days as augmentin.

## 2013-11-06 NOTE — Telephone Encounter (Signed)
Called, spoke with pt.  Pt was started on augmentin bid x 10 days on 11/4 by [facility].  Pt states prior to starting this he had 1 loose stool but it wasn't diarrhea. He took the first abx dose on Tuesday, and reports he started to have diarrhea after this.  Yesterday the diarrhea worsened to the point that he couldn't finish urinating without needing to stop to have a bowel movement.  Because of this, pt did not take last night's dose or this morning's dose of augmentin.  States the diarrhea has improved some.  Pt states he is taking the abx on a full stomach with a large glass of water.  Pt states he has never had to take a probiotic or eat yogurt when on abx.  He reports he is feeling better with the doses of abx he has already taken.   Pt requesting KC's recs.  Dr. Shelle Iron, pls advise.  Thank you.

## 2013-11-06 NOTE — Telephone Encounter (Signed)
Spouse and pt is aware of recs. rx has been called in. Nothing further needed

## 2013-11-06 NOTE — Telephone Encounter (Signed)
Called, spoke with pt.  Informed him of below per [facility].  He verbalized understanding of this.  Given the weekend is coming up, pt would like to switch to a different abx.  Dr. Shelle Iron, pls advise.  Thank you.  [facility]

## 2013-11-06 NOTE — Telephone Encounter (Signed)
Usually if pt's take on full stomach with completely drinking at least 8 ounces of water, diarrhea is usually not an issue.  He can certainly try yogurt to see if this helps (am and pm prior to dose).  The other option is to change to a different abx.  Let me know what he decides.

## 2013-12-25 ENCOUNTER — Other Ambulatory Visit: Payer: Self-pay | Admitting: *Deleted

## 2013-12-25 MED ORDER — INSULIN PEN NEEDLE 32G X 6 MM MISC: Status: DC

## 2014-01-04 ENCOUNTER — Other Ambulatory Visit (INDEPENDENT_AMBULATORY_CARE_PROVIDER_SITE_OTHER): Payer: Medicare PPO

## 2014-01-04 DIAGNOSIS — E785 Hyperlipidemia, unspecified: Secondary | ICD-10-CM

## 2014-01-04 DIAGNOSIS — IMO0001 Reserved for inherently not codable concepts without codable children: Secondary | ICD-10-CM

## 2014-01-04 DIAGNOSIS — E1165 Type 2 diabetes mellitus with hyperglycemia: Principal | ICD-10-CM

## 2014-01-04 DIAGNOSIS — M3320 Polymyositis, organ involvement unspecified: Secondary | ICD-10-CM

## 2014-01-04 LAB — CK: CK TOTAL: 125 U/L (ref 7–232)

## 2014-01-04 LAB — BASIC METABOLIC PANEL
BUN: 14 mg/dL (ref 6–23)
CO2: 28 mEq/L (ref 19–32)
Calcium: 9.2 mg/dL (ref 8.4–10.5)
Chloride: 106 mEq/L (ref 96–112)
Creatinine, Ser: 0.9 mg/dL (ref 0.4–1.5)
GFR: 100.76 mL/min (ref >60.00)
GLUCOSE: 123 mg/dL — AB (ref 70–99)
Potassium: 4.7 mEq/L (ref 3.5–5.1)
Sodium: 137 mEq/L (ref 135–145)

## 2014-01-04 LAB — LIPID PANEL
Cholesterol: 124 mg/dL (ref 0–200)
HDL: 69.3 mg/dL (ref >39.00)
LDL Cholesterol: 41 mg/dL (ref 0–99)
Total CHOL/HDL Ratio: 2
Triglycerides: 71 mg/dL (ref 0.0–149.0)
VLDL: 14.2 mg/dL (ref 0.0–40.0)

## 2014-01-04 LAB — HEMOGLOBIN A1C: Hgb A1c MFr Bld: 7 % — ABNORMAL HIGH (ref 4.6–6.5)

## 2014-01-06 ENCOUNTER — Encounter: Payer: Self-pay | Admitting: Endocrinology

## 2014-01-06 ENCOUNTER — Ambulatory Visit (INDEPENDENT_AMBULATORY_CARE_PROVIDER_SITE_OTHER): Payer: Medicare PPO | Admitting: Endocrinology

## 2014-01-06 VITALS — BP 118/72 | HR 79 | Temp 98.2°F | Resp 12 | Ht 75.0 in | Wt 201.6 lb

## 2014-01-06 DIAGNOSIS — I10 Essential (primary) hypertension: Secondary | ICD-10-CM

## 2014-01-06 DIAGNOSIS — M3320 Polymyositis, organ involvement unspecified: Secondary | ICD-10-CM

## 2014-01-06 DIAGNOSIS — IMO0001 Reserved for inherently not codable concepts without codable children: Secondary | ICD-10-CM

## 2014-01-06 DIAGNOSIS — E1165 Type 2 diabetes mellitus with hyperglycemia: Principal | ICD-10-CM

## 2014-01-06 NOTE — Patient Instructions (Signed)
Next Rx for Azor to be 5/20  Appointment on 01/04/2014  Component Date Value Range Status  . Hemoglobin A1C 01/04/2014 7.0* 4.6 - 6.5 % Final   Glycemic Control Guidelines for People with Diabetes:Non Diabetic:  <6%Goal of Therapy: <7%Additional Action Suggested:  >8%   . Sodium 01/04/2014 137  135 - 145 mEq/L Final  . Potassium 01/04/2014 4.7  3.5 - 5.1 mEq/L Final  . Chloride 01/04/2014 106  96 - 112 mEq/L Final  . CO2 01/04/2014 28  19 - 32 mEq/L Final  . Glucose, Bld 01/04/2014 123* 70 - 99 mg/dL Final  . BUN 01/04/2014 14  6 - 23 mg/dL Final  . Creatinine, Ser 01/04/2014 0.9  0.4 - 1.5 mg/dL Final  . Calcium 01/04/2014 9.2  8.4 - 10.5 mg/dL Final  . GFR 01/04/2014 100.76  >60.00 mL/min Final  . Cholesterol 01/04/2014 124  0 - 200 mg/dL Final   ATP III Classification       Desirable:  < 200 mg/dL               Borderline High:  200 - 239 mg/dL          High:  > = 240 mg/dL  . Triglycerides 01/04/2014 71.0  0.0 - 149.0 mg/dL Final   Normal:  <150 mg/dLBorderline High:  150 - 199 mg/dL  . HDL 01/04/2014 69.30  >39.00 mg/dL Final  . VLDL 01/04/2014 14.2  0.0 - 40.0 mg/dL Final  . LDL Cholesterol 01/04/2014 41  0 - 99 mg/dL Final  . Total CHOL/HDL Ratio 01/04/2014 2   Final                  Men          Women1/2 Average Risk     3.4          3.3Average Risk          5.0          4.42X Average Risk          9.6          7.13X Average Risk          15.0          11.0                      . Total CK 01/04/2014 125  7 - 232 U/L Final

## 2014-01-06 NOTE — Progress Notes (Signed)
Patient ID: Mark Daugherty, male   DOB: 07/12/1941, 73 y.o.   MRN: [id number]  Reason for Appointment: Diabetes follow-up   History of Present Illness   Diagnosis: Type 2 DIABETES MELITUS, date of diagnosis: 1995         He has been on various combinations of medications previously for diabetes including Amaryl  and insulin which have been discontinued Because of higher readings at times and weight gain with excessive snacking he was switched from Januvia to  Victoza He has been tolerating 0.6 mg with only mild transient nausea, was nauseated with 1.2. However his glucose and weight have improved significantly with the smaller dose His A1c is still relatively high but improved and adequate for his age  Oral hypoglycemic drugs: metformin and Actos, previously was on Amaryl         Side effects from medications: nausea from 1.2 mg Victoza  Monitors blood glucose: Once a day.    Glucometer: One Touch.          Blood Glucose readings: Glucometer download reviewed:  PREMEAL Breakfast Lunch Dinner Bedtime Overall  Glucose range:  93-135    122   85-158    Mean/median:  112     118   108    POST-MEAL PC Breakfast PC Lunch PC Dinner  Glucose range:  81, 94   94-115    Mean/median:      Hypoglycemia not present Meals: 3 meals per day.          Physical activity: exercise:  no specific program currently            Wt Readings from Last 3 Encounters:  01/06/14 201 lb 9.6 oz (91.445 kg)  11/03/13 210 lb 3.2 oz (95.346 kg)  10/07/13 204 lb (92.534 kg)    Lab Results  Component Value Date   HGBA1C 7.0* 01/04/2014   HGBA1C 7.2* 07/10/2013   Lab Results  Component Value Date   MICROALBUR 1.9 08/21/2013   LDLCALC 41 01/04/2014   CREATININE 0.9 01/04/2014    HYPERTENSION:  he has had long-standing hypertension which is well controlled. Not feeling lightheaded on standing up currently. His Coreg was reduced to half a tablet in the morning when he was having lightheadedness. Does monitor  periodically at home and recent readings are about [id number] systolic. Currently on a 3 drug regimen without diuretics   LABS:  Appointment on 01/04/2014  Component Date Value Range Status  . Hemoglobin A1C 01/04/2014 7.0* 4.6 - 6.5 % Final   Glycemic Control Guidelines for People with Diabetes:Non Diabetic:  <6%Goal of Therapy: <7%Additional Action Suggested:  >8%   . Sodium 01/04/2014 137  135 - 145 mEq/L Final  . Potassium 01/04/2014 4.7  3.5 - 5.1 mEq/L Final  . Chloride 01/04/2014 106  96 - 112 mEq/L Final  . CO2 01/04/2014 28  19 - 32 mEq/L Final  . Glucose, Bld 01/04/2014 123* 70 - 99 mg/dL Final  . BUN 01/04/2014 14  6 - 23 mg/dL Final  . Creatinine, Ser 01/04/2014 0.9  0.4 - 1.5 mg/dL Final  . Calcium 01/04/2014 9.2  8.4 - 10.5 mg/dL Final  . GFR 01/04/2014 100.76  >60.00 mL/min Final  . Cholesterol 01/04/2014 124  0 - 200 mg/dL Final   ATP III Classification       Desirable:  < 200 mg/dL               Borderline High:  200 - 239 mg/dL  High:  > = 240 mg/dL  . Triglycerides 01/04/2014 71.0  0.0 - 149.0 mg/dL Final   Normal:  <150 mg/dLBorderline High:  150 - 199 mg/dL  . HDL 01/04/2014 69.30  >39.00 mg/dL Final  . VLDL 01/04/2014 14.2  0.0 - 40.0 mg/dL Final  . LDL Cholesterol 01/04/2014 41  0 - 99 mg/dL Final  . Total CHOL/HDL Ratio 01/04/2014 2   Final                  Men          Women1/2 Average Risk     3.4          3.3Average Risk          5.0          4.42X Average Risk          9.6          7.13X Average Risk          15.0          11.0                      . Total CK 01/04/2014 125  7 - 232 U/L Final      Medication List       This list is accurate as of: 01/06/14  8:04 AM.  Always use your most recent med list.               amLODipine-olmesartan 5-40 MG per tablet  Commonly known as:  AZOR  Take 1 tablet by mouth daily.     aspirin 81 MG tablet  Take 81 mg by mouth daily.     azathioprine 100 MG tablet  Commonly known as:  IMURAN  Take 100 mg  by mouth daily.     carvedilol 25 MG tablet  Commonly known as:  COREG  Take 1 tablet (25 mg total) by mouth 2 (two) times daily with a meal.     cefdinir 300 MG capsule  Commonly known as:  OMNICEF  Take 2 every morning     chlorhexidine 0.12 % solution  Commonly known as:  PERIDEX     finasteride 5 MG tablet  Commonly known as:  PROSCAR  Take 5 mg by mouth daily.     glucose blood test strip  Commonly known as:  ONE TOUCH ULTRA TEST  Use to check blood sugars 3 times per day     ibuprofen 200 MG tablet  Commonly known as:  ADVIL,MOTRIN  Take 200 mg by mouth every 6 (six) hours as needed for fever.     Insulin Pen Needle 32G X 6 MM Misc  Commonly known as:  NOVOFINE  Use daily with Victoza     Liraglutide 18 MG/3ML Sopn  Inject 0.6 mg into the skin daily.     metFORMIN 500 MG tablet  Commonly known as:  GLUCOPHAGE  500 mg. 1 tablet in the am, and 2 tablets at night     multivitamin tablet  Take 1 tablet by mouth daily.     pioglitazone 30 MG tablet  Commonly known as:  ACTOS  Take 1 tablet (30 mg total) by mouth daily.     ROBITUSSIN CHILD COUGH/COLD CF PO  Take by mouth. 1-2 tsp for cough PRN     sildenafil 100 MG tablet  Commonly known as:  VIAGRA  Take 1 tablet (100 mg total) by mouth as needed.  simvastatin 20 MG tablet  Commonly known as:  ZOCOR  Take 1 tablet (20 mg total) by mouth at bedtime.        Allergies:  Allergies  Allergen Reactions  . Augmentin [Amoxicillin-Pot Clavulanate] Diarrhea  . Codeine   . Lisinopril   . Sulfonamide Derivatives     Past Medical History  Diagnosis Date  . Aortic insufficiency   . Thoracic aortic aneurysm   . Hypertension   . Polymyositis   . Prostate cancer   . Diabetes mellitus     Past Surgical History  Procedure Laterality Date  . Radioactive seed implant    . US echocardiography  12/05/2010    EF 55-65%  . Cardiovascular stress test  06/12/2000    EF 59%    Family History  Problem  Relation Age of Onset  . Stroke Father   . Hypertension Sister   . Diabetes Sister   . Heart attack Brother   . Hypertension Brother     Social History:  reports that he has never smoked. He does not have any smokeless tobacco history on file. He reports that he does not drink alcohol or use illicit drugs.  Review of Systems - Cardiovascular ROS: positive for - aortic regurgitation followed by cardiologist He has had polymyositis followed by rheumatologist, CPK is normal. No joint pains HYPERLIPIDEMIA:         The lipid abnormality consists of  mildly elevated LDL controlled with simvastatin 20 mg.   History of benign prostatic hypertrophy followed by urologist; now having difficulty with urination because of urethral stricture   Examination:   BP 118/72  Pulse 79  Temp(Src) 98.2 F (36.8 C)  Resp 12  Ht 6\' 3"  (1.905 m)  Wt 201 lb 9.6 oz (91.445 kg)  BMI 25.20 kg/m2  SpO2 99%  Body mass index is 25.2 kg/(m^2).    Assesment:   HYPERTENSION: Blood pressure is quite well controlled, relatively low normal but asymptomatic On his next prescription will reduce his Benicar to 20 mg Meanwhile he will continue to monitor at home  Diabetes type 2   His blood sugars are overall well controlled with adding 0.6 mg Victoza which he is tolerating well.   Since his renal function is normal he can continue metformin unchanged Has been very compliant with checking his blood sugar  However has not been exercising and encouraged him to do so especially for cardiovascular fitness  Urethral stricture: Advised him to call urologist for urgent appointment because of his symptoms  Mark Daugherty 01/06/2014, 8:04 AM

## 2014-02-08 ENCOUNTER — Other Ambulatory Visit: Payer: Self-pay | Admitting: *Deleted

## 2014-02-08 ENCOUNTER — Telehealth: Payer: Self-pay | Admitting: *Deleted

## 2014-02-08 MED ORDER — AMLODIPINE-OLMESARTAN 5-20 MG PO TABS: 1.0000 | ORAL_TABLET | Freq: Every day | ORAL | Status: DC

## 2014-02-08 NOTE — Telephone Encounter (Signed)
rx sent, patient is aware 

## 2014-02-08 NOTE — Telephone Encounter (Signed)
Azor to be changed from 5/40 to 5/20

## 2014-02-08 NOTE — Telephone Encounter (Signed)
Patient called in for a refill of his Azor, he said at last visit you were going to change the dose, when I looked in his chart I seen that you had written  HYPERTENSION: Blood pressure is quite well controlled, relatively low normal but asymptomatic  On his next prescription will reduce his Benicar to 20 mg  Meanwhile he will continue to monitor at home.   I don't see him being on Benicar?  Please advise?

## 2014-02-19 ENCOUNTER — Telehealth: Payer: Self-pay | Admitting: *Deleted

## 2014-02-19 NOTE — Telephone Encounter (Signed)
Ok, will need appropriate diagnosis also

## 2014-02-19 NOTE — Telephone Encounter (Signed)
Patient wants to make sure it's okay for him to have his labs drawn here for Dr. Estanislado Pandy?  He has 3 things that his pcp wants drawn.  Please advise

## 2014-02-24 ENCOUNTER — Other Ambulatory Visit: Payer: Self-pay | Admitting: *Deleted

## 2014-02-24 DIAGNOSIS — IMO0001 Reserved for inherently not codable concepts without codable children: Secondary | ICD-10-CM

## 2014-02-24 DIAGNOSIS — Z79899 Other long term (current) drug therapy: Secondary | ICD-10-CM

## 2014-02-24 DIAGNOSIS — M2550 Pain in unspecified joint: Secondary | ICD-10-CM

## 2014-02-24 DIAGNOSIS — E1165 Type 2 diabetes mellitus with hyperglycemia: Principal | ICD-10-CM

## 2014-03-15 ENCOUNTER — Other Ambulatory Visit: Payer: Medicare PPO

## 2014-03-16 ENCOUNTER — Other Ambulatory Visit: Payer: Self-pay | Admitting: *Deleted

## 2014-03-16 ENCOUNTER — Other Ambulatory Visit: Payer: Medicare PPO

## 2014-03-16 ENCOUNTER — Other Ambulatory Visit (INDEPENDENT_AMBULATORY_CARE_PROVIDER_SITE_OTHER): Payer: Medicare PPO

## 2014-03-16 DIAGNOSIS — Z79899 Other long term (current) drug therapy: Secondary | ICD-10-CM

## 2014-03-16 DIAGNOSIS — E1165 Type 2 diabetes mellitus with hyperglycemia: Principal | ICD-10-CM

## 2014-03-16 DIAGNOSIS — IMO0001 Reserved for inherently not codable concepts without codable children: Secondary | ICD-10-CM

## 2014-03-16 DIAGNOSIS — M2550 Pain in unspecified joint: Secondary | ICD-10-CM

## 2014-03-16 LAB — LIPID PANEL
Cholesterol: 121 mg/dL (ref 0–200)
HDL: 72.6 mg/dL (ref >39.00)
LDL Cholesterol: 36 mg/dL (ref 0–99)
Total CHOL/HDL Ratio: 2
Triglycerides: 62 mg/dL (ref 0.0–149.0)
VLDL: 12.4 mg/dL (ref 0.0–40.0)

## 2014-03-16 LAB — HEMOGLOBIN A1C: Hgb A1c MFr Bld: 7.1 % — ABNORMAL HIGH (ref 4.6–6.5)

## 2014-03-16 LAB — CK: Total CK: 152 U/L (ref 7–232)

## 2014-03-17 LAB — CBC WITH DIFFERENTIAL/PLATELET
BASOS ABS: 0 10*3/uL (ref 0.0–0.2)
Basos: 0 %
EOS: 2 %
Eosinophils Absolute: 0.1 10*3/uL (ref 0.0–0.4)
HEMATOCRIT: 34.6 % — AB (ref 37.5–51.0)
Hemoglobin: 11.6 g/dL — ABNORMAL LOW (ref 12.6–17.7)
Immature Grans (Abs): 0 10*3/uL (ref 0.0–0.1)
Immature Granulocytes: 0 %
LYMPHS ABS: 1 10*3/uL (ref 0.7–3.1)
Lymphs: 18 %
MCH: 30.8 pg (ref 26.6–33.0)
MCHC: 33.5 g/dL (ref 31.5–35.7)
MCV: 92 fL (ref 79–97)
Monocytes Absolute: 0.8 10*3/uL (ref 0.1–0.9)
Monocytes: 15 %
Neutrophils Absolute: 3.5 10*3/uL (ref 1.4–7.0)
Neutrophils Relative %: 65 %
RBC: 3.77 x10E6/uL — ABNORMAL LOW (ref 4.14–5.80)
RDW: 14.3 % (ref 12.3–15.4)
WBC: 5.4 10*3/uL (ref 3.4–10.8)

## 2014-03-17 LAB — COMPLETE METABOLIC PANEL WITH GFR
ALT: 8 U/L (ref 0–53)
AST: 17 U/L (ref 0–37)
Albumin: 3.8 g/dL (ref 3.5–5.2)
Alkaline Phosphatase: 37 U/L — ABNORMAL LOW (ref 39–117)
BUN: 13 mg/dL (ref 6–23)
CALCIUM: 9 mg/dL (ref 8.4–10.5)
CHLORIDE: 102 meq/L (ref 96–112)
CO2: 32 mEq/L (ref 19–32)
Creat: 1.25 mg/dL (ref 0.50–1.35)
GFR, Est African American: 65 mL/min
GFR, Est Non African American: 56 mL/min — ABNORMAL LOW
Glucose, Bld: 108 mg/dL — ABNORMAL HIGH (ref 70–99)
Potassium: 4 mEq/L (ref 3.5–5.3)
Sodium: 141 mEq/L (ref 135–145)
Total Bilirubin: 0.4 mg/dL (ref 0.2–1.2)
Total Protein: 6.9 g/dL (ref 6.0–8.3)

## 2014-03-17 LAB — FRUCTOSAMINE: FRUCTOSAMINE: 254 umol/L (ref 0–285)

## 2014-03-22 ENCOUNTER — Ambulatory Visit (INDEPENDENT_AMBULATORY_CARE_PROVIDER_SITE_OTHER): Payer: Medicare PPO | Admitting: Cardiology

## 2014-03-22 ENCOUNTER — Encounter: Payer: Self-pay | Admitting: Cardiology

## 2014-03-22 VITALS — BP 152/60 | HR 72 | Ht 75.0 in | Wt 194.8 lb

## 2014-03-22 DIAGNOSIS — I712 Thoracic aortic aneurysm, without rupture, unspecified: Secondary | ICD-10-CM

## 2014-03-22 DIAGNOSIS — I359 Nonrheumatic aortic valve disorder, unspecified: Secondary | ICD-10-CM

## 2014-03-22 DIAGNOSIS — I351 Nonrheumatic aortic (valve) insufficiency: Secondary | ICD-10-CM

## 2014-03-22 DIAGNOSIS — I10 Essential (primary) hypertension: Secondary | ICD-10-CM

## 2014-03-22 DIAGNOSIS — E785 Hyperlipidemia, unspecified: Secondary | ICD-10-CM

## 2014-03-22 NOTE — Patient Instructions (Signed)
Continue your current therapy  We may need to increase your BP therapy if your BP remains high.  I will see you in 6 months.

## 2014-03-22 NOTE — Progress Notes (Signed)
Mark Daugherty Date of Birth: 29-Nov-1941   History of Present Illness: Mark Daugherty is seen today for followup. He has a history of thoracic aortic aneurysm and chronic aortic insufficiency which is moderate to severe. He has a history of polymyositis. He also has a history of diabetes and prostate cancer. He has been followed here since 2004 and his echocardiograms and CT scans have been stable. His last evaluation was in November 2013. He reports that he is doing very well. He denies any significant chest pain, shortness of breath, or palpitations. He's had no edema.  He did reduce his Azor dose and since then BP readings have been running higher since then. He is concerned about weight loss on Victoza. He does have urinary retention and currently has a catheter in place.  Current Outpatient Prescriptions on File Prior to Visit  Medication Sig Dispense Refill  . amLODipine-olmesartan (AZOR) 5-20 MG per tablet Take 1 tablet by mouth daily.  90 tablet  1  . aspirin 81 MG tablet Take 81 mg by mouth daily.      Marland Kitchen azathioprine (IMURAN) 100 MG tablet Take 100 mg by mouth daily.        . carvedilol (COREG) 25 MG tablet Take 1 tablet (25 mg total) by mouth 2 (two) times daily with a meal.  180 tablet  3  . finasteride (PROSCAR) 5 MG tablet Take 5 mg by mouth daily.        Marland Kitchen glucose blood (ONE TOUCH ULTRA TEST) test strip Use to check blood sugars 3 times per day  300 each  3  . Insulin Pen Needle (NOVOFINE) 32G X 6 MM MISC Use daily with Victoza  50 each  5  . Liraglutide 18 MG/3ML SOPN Inject 0.6 mg into the skin daily.      . metFORMIN (GLUCOPHAGE) 500 MG tablet 500 mg. 1 tablet in the am, and 2 tablets at night      . Multiple Vitamin (MULTIVITAMIN) tablet Take 1 tablet by mouth daily.        . pioglitazone (ACTOS) 30 MG tablet Take 1 tablet (30 mg total) by mouth daily.  90 tablet  2  . sildenafil (VIAGRA) 100 MG tablet Take 1 tablet (100 mg total) by mouth as needed.  10 tablet  3  . simvastatin  (ZOCOR) 20 MG tablet Take 1 tablet (20 mg total) by mouth at bedtime.  90 tablet  3   No current facility-administered medications on file prior to visit.    Allergies  Allergen Reactions  . Augmentin [Amoxicillin-Pot Clavulanate] Diarrhea  . Codeine   . Lisinopril   . Sulfonamide Derivatives     Past Medical History  Diagnosis Date  . Aortic insufficiency   . Thoracic aortic aneurysm   . Hypertension   . Polymyositis   . Prostate cancer   . Diabetes mellitus   . Urinary retention     Past Surgical History  Procedure Laterality Date  . Radioactive seed implant    . US echocardiography  12/05/2010    EF 55-65%  . Cardiovascular stress test  06/12/2000    EF 59%    History  Smoking status  . Never Smoker   Smokeless tobacco  . Not on file    History  Alcohol Use No    Family History  Problem Relation Age of Onset  . Stroke Father   . Hypertension Sister   . Diabetes Sister   . Heart attack Brother   .  Hypertension Brother     Review of Systems: As noted in HPI.  All other systems were reviewed and are negative.  Physical Exam: BP 152/60  Pulse 72  Ht 6\' 3"  (1.905 m)  Wt 194 lb 12.8 oz (88.361 kg)  BMI 24.35 kg/m2 He is a pleasant black male in no acute distress. He is normocephalic, atraumatic. Pupils are equal round and reactive to light and accommodation. Extraocular movements are full. Oropharynx is clear. Neck is supple without JVD, adenopathy, thyromegaly, or bruits. Lungs are clear. Cardiac exam reveals a regular rate and rhythm with a grade 2/6 systolic ejection murmur at the right upper sternal border. There is a very soft early diastolic murmur. He has no S3. Abdomen is soft and nontender without masses or bruits. He has good pedal pulses. There is no edema. He is alert and oriented x3. Cranial nerves II through XII are intact.  Laboratory data:  Joretta Bachelor, MD 11/18/2012        Narrative       *RADIOLOGY REPORT*  Clinical Data: Follow  up of known thoracic aortic aneurysm  CT ANGIOGRAPHY CHEST  Technique: Multidetector CT imaging of the chest using the standard protocol during bolus administration of intravenous contrast. Multiplanar reconstructed images including MIPs were obtained and reviewed to evaluate the vascular anatomy.  Contrast: 125mL OMNIPAQUE IOHEXOL 350 MG/ML SOLN  Comparison: CT chest of 12/05/2010  Findings: There are primarily interstitial opacities at the lung bases with some mild bronchiectatic change consistent with interstitial lung disease. An active component is difficult to exclude. There also are slightly more prominent interstitial markings throughout the remainder of the lungs. There are a few small nodular opacities within the right upper lobe which are new and continued follow-up is recommended. No pleural effusion is seen. An element of interstitial edema is difficult to exclude.  On soft tissue window images, the thyroid gland is unremarkable. Fusiform dilatation of the thoracic aorta is relatively stable measuring 5.0 x 4.5 cm compared to 4.9 x 4.8 cm previously. No acute thoracic abnormality is evident. The aortic root also is dilated measuring up to 4.6 cm. The thoracic arch is normal in caliber as is the descending thoracic aorta. Cardiomegaly is stable. The pulmonary arteries opacify with no acute abnormality noted. The upper abdomen is unremarkable.  IMPRESSION:  1. Increase in basilar interstitial opacities with some bronchiectatic change most consistent with chronic interstitial lung disease. An active component is difficult to exclude. 2. Small nodular and peribronchovascular opacities in the right upper lobe which appear new. Recommend continued follow-up CT chest in 4-6 months. 3. Cannot exclude an element of mild interstitial edema. 4. Relatively stable fusiform dilatation of the ascending aorta with maximal diameter of 5.0 cm compared to 4.9 cm  previously. Cardiomegaly.   Original Report Authenticated By: Ivar Drape, M.D.       Echo:Study Conclusions  - Left ventricle: The cavity size was normal. Wall thickness was increased in a pattern of mild LVH. The estimated ejection fraction was 55%. Wall motion was normal; there were no regional wall motion abnormalities. Doppler parameters are consistent with abnormal left ventricular relaxation (grade 1 diastolic dysfunction). Internal dimension:31mm (ED, chordal level, PLAX). - Aortic valve: There was no stenosis. Moderate to severe regurgitation. Regurgitation pressure half-time: 370ms. - Aorta: Dilated aortic root and ascending aorta. Visualized ascending aorta measured 4.9 cm. - Mitral valve: Mild regurgitation. - Left atrium: The atrium was mildly dilated. - Right ventricle: The cavity size was normal. Systolic function  was normal. - Tricuspid valve: Peak RV-RA gradient: 28mm Hg (S). - Pulmonary arteries: PA peak pressure: 31mm Hg (S). - Inferior vena cava: The vessel was normal in size; the respirophasic diameter changes were in the normal range (= 50%); findings are consistent with normal central venous pressure. Impressions:  - Normal LV size with mild LV hypertrophy, EF 55%. The aortic valve was trileaflet with no stenosis and moderate to severe regurgitation. The aortic root and ascending aorta wereaneurysmal, see above for dimensions. Normal RV size and systolic function with mild pulmonary hypertension.   Assessment / Plan: 1. Thoracic aortic aneurysm. 5.0 cm. Size is stable by CT. We have been following this every 2 years with stable exams. Will consider follow up CT in the fall. He is asymptomatic.  2. Chronic aortic insufficiency, moderate to severe. Patient is asymptomatic. Follow up Echo in the fall.  3. Hypertension. BP running higher with reduction in Azor dose. May need to resume higher dose- he is going to discuss with Dr. Dwyane Dee on Apr. 1.   4.  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  5. Diabetes mellitus type 2.  6. Urinary retention. S/p radioactive seed implant for prostate CA.

## 2014-03-29 ENCOUNTER — Other Ambulatory Visit: Payer: Self-pay | Admitting: *Deleted

## 2014-03-29 MED ORDER — METFORMIN HCL 500 MG PO TABS: ORAL_TABLET | ORAL | Status: DC

## 2014-04-06 ENCOUNTER — Other Ambulatory Visit: Payer: Medicare PPO

## 2014-04-08 ENCOUNTER — Ambulatory Visit: Payer: Medicare PPO | Admitting: Pulmonary Disease

## 2014-04-09 ENCOUNTER — Ambulatory Visit: Payer: Medicare PPO | Admitting: Endocrinology

## 2014-04-20 ENCOUNTER — Encounter: Payer: Self-pay | Admitting: Endocrinology

## 2014-04-20 ENCOUNTER — Ambulatory Visit (INDEPENDENT_AMBULATORY_CARE_PROVIDER_SITE_OTHER): Payer: Medicare PPO | Admitting: Endocrinology

## 2014-04-20 VITALS — BP 154/64 | HR 88 | Temp 97.9°F | Resp 16 | Ht 75.0 in | Wt 190.4 lb

## 2014-04-20 DIAGNOSIS — M3320 Polymyositis, organ involvement unspecified: Secondary | ICD-10-CM

## 2014-04-20 DIAGNOSIS — I10 Essential (primary) hypertension: Secondary | ICD-10-CM

## 2014-04-20 DIAGNOSIS — R634 Abnormal weight loss: Secondary | ICD-10-CM

## 2014-04-20 DIAGNOSIS — D649 Anemia, unspecified: Secondary | ICD-10-CM

## 2014-04-20 DIAGNOSIS — E1165 Type 2 diabetes mellitus with hyperglycemia: Principal | ICD-10-CM

## 2014-04-20 DIAGNOSIS — IMO0001 Reserved for inherently not codable concepts without codable children: Secondary | ICD-10-CM

## 2014-04-20 LAB — COMPREHENSIVE METABOLIC PANEL
ALT: 10 U/L (ref 0–53)
AST: 18 U/L (ref 0–37)
Albumin: 3.6 g/dL (ref 3.5–5.2)
Alkaline Phosphatase: 33 U/L — ABNORMAL LOW (ref 39–117)
BUN: 12 mg/dL (ref 6–23)
CO2: 29 mEq/L (ref 19–32)
CREATININE: 0.9 mg/dL (ref 0.4–1.5)
Calcium: 9.2 mg/dL (ref 8.4–10.5)
Chloride: 104 mEq/L (ref 96–112)
GFR: 104.52 mL/min (ref >60.00)
Glucose, Bld: 108 mg/dL — ABNORMAL HIGH (ref 70–99)
Potassium: 3.9 mEq/L (ref 3.5–5.1)
Sodium: 138 mEq/L (ref 135–145)
Total Bilirubin: 0.6 mg/dL (ref 0.3–1.2)
Total Protein: 7.3 g/dL (ref 6.0–8.3)

## 2014-04-20 LAB — MICROALBUMIN / CREATININE URINE RATIO
Creatinine,U: 78.2 mg/dL
MICROALB/CREAT RATIO: 8.8 mg/g (ref 0.0–30.0)
Microalb, Ur: 6.9 mg/dL — ABNORMAL HIGH (ref 0.0–1.9)

## 2014-04-20 LAB — GLUCOSE, POCT (MANUAL RESULT ENTRY): POC GLUCOSE: 161 mg/dL — AB (ref 70–99)

## 2014-04-20 LAB — CBC WITH DIFFERENTIAL/PLATELET
BASOS ABS: 0 10*3/uL (ref 0.0–0.1)
Basophils Relative: 0.4 % (ref 0.0–3.0)
EOS ABS: 0.1 10*3/uL (ref 0.0–0.7)
Eosinophils Relative: 2.5 % (ref 0.0–5.0)
HEMATOCRIT: 33.7 % — AB (ref 39.0–52.0)
Hemoglobin: 11.3 g/dL — ABNORMAL LOW (ref 13.0–17.0)
LYMPHS ABS: 1.2 10*3/uL (ref 0.7–4.0)
Lymphocytes Relative: 24.9 % (ref 12.0–46.0)
MCHC: 33.4 g/dL (ref 30.0–36.0)
MCV: 92.1 fl (ref 78.0–100.0)
MONOS PCT: 12.9 % — AB (ref 3.0–12.0)
Monocytes Absolute: 0.6 10*3/uL (ref 0.1–1.0)
Neutro Abs: 2.9 10*3/uL (ref 1.4–7.7)
Neutrophils Relative %: 59.3 % (ref 43.0–77.0)
PLATELETS: 186 10*3/uL (ref 150.0–400.0)
RBC: 3.65 Mil/uL — ABNORMAL LOW (ref 4.22–5.81)
RDW: 14.7 % — AB (ref 11.5–14.6)
WBC: 4.9 10*3/uL (ref 4.5–10.5)

## 2014-04-20 LAB — IBC PANEL
IRON: 59 ug/dL (ref 42–165)
Saturation Ratios: 20.9 % (ref 20.0–50.0)
Transferrin: 202 mg/dL — ABNORMAL LOW (ref 212.0–360.0)

## 2014-04-20 MED ORDER — AMLODIPINE BESYLATE 5 MG PO TABS: 5.0000 mg | ORAL_TABLET | Freq: Every day | ORAL | Status: DC

## 2014-04-20 NOTE — Progress Notes (Signed)
Patient ID: Mark Daugherty, male   DOB: [birth year], 73 y.o.   MRN: [id number]   Reason for Appointment:  follow-up   History of Present Illness   Diagnosis: Type 2 DIABETES MELITUS, date of diagnosis: 1995         He has been on various combinations of medications previously for diabetes including Amaryl  and insulin which have been discontinued Because of higher readings at times and weight gain with excessive snacking he was switched from Januvia to  Victoza His A1c has been generally around 7% including in 3/15 He has recently taken this on a 3 times a week with a 0.6 mg dose because of decreased appetite and weight loss His blood sugars have stayed about the same despite lowering his dose Also because of intercurrent illnesses with his urinary problems he has not exercised as much  Oral hypoglycemic drugs: metformin and Actos, previously was on Amaryl         Side effects from medications: nausea from 1.2 mg Victoza  Monitors blood glucose: Once a day.    Glucometer: One Touch.          Blood Glucose readings:  by recall:    PREMEAL Breakfast pc  Dinner Bedtime Overall  Glucose range: 89-124 <140     Mean/median:        Hypoglycemia not present Meals: 3 meals per day.          Physical activity: exercise:  Some yard work          [employer] from Last 3 Encounters:  04/20/14 190 lb 6.4 oz (86.365 kg)  03/22/14 194 lb 12.8 oz (88.361 kg)  01/06/14 201 lb 9.6 oz (91.445 kg)    Lab Results  Component Value Date   HGBA1C 7.1* 03/16/2014   HGBA1C 7.0* 01/04/2014   HGBA1C 7.2* 07/10/2013   Lab Results  Component Value Date   MICROALBUR 1.9 08/21/2013   LDLCALC 36 03/16/2014   CREATININE 1.25 03/16/2014    HYPERTENSION:  he has had long-standing hypertension which is usually well controlled. Because of low normal blood pressure on the last visit his Benicar was reduced to 20 mg  His blood pressure with the cardiologist was also about 672 systolic Does monitor periodically at  home and recent readings are still fairly good at 094 systolic.    LABS:  Office Visit on 04/20/2014  Component Date Value Ref Range Status  . POC Glucose 04/20/2014 161* 70 - 99 mg/dl Final      Medication List       This list is accurate as of: 04/20/14  8:29 AM.  Always use your most recent med list.               amLODipine-olmesartan 5-20 MG per tablet  Commonly known as:  AZOR  Take 1 tablet by mouth daily.     aspirin 81 MG tablet  Take 81 mg by mouth daily.     azathioprine 100 MG tablet  Commonly known as:  IMURAN  Take 100 mg by mouth daily.     carvedilol 25 MG tablet  Commonly known as:  COREG  Take 1 tablet (25 mg total) by mouth 2 (two) times daily with a meal.     finasteride 5 MG tablet  Commonly known as:  PROSCAR  Take 5 mg by mouth daily.     glucose blood test strip  Commonly known as:  ONE TOUCH ULTRA TEST  Use to check blood sugars  3 times per day     Insulin Pen Needle 32G X 6 MM Misc  Commonly known as:  NOVOFINE  Use daily with Victoza     Liraglutide 18 MG/3ML Sopn  Inject 0.6 mg into the skin daily. Injects 3 times per week     metFORMIN 500 MG tablet  Commonly known as:  GLUCOPHAGE  1 tablet in the am, and 2 tablets at night     multivitamin tablet  Take 1 tablet by mouth daily.     pioglitazone 30 MG tablet  Commonly known as:  ACTOS  Take 1 tablet (30 mg total) by mouth daily.     sildenafil 100 MG tablet  Commonly known as:  VIAGRA  Take 1 tablet (100 mg total) by mouth as needed.     simvastatin 20 MG tablet  Commonly known as:  ZOCOR  Take 1 tablet (20 mg total) by mouth at bedtime.        Allergies:  Allergies  Allergen Reactions  . Augmentin [Amoxicillin-Pot Clavulanate] Diarrhea  . Codeine   . Lisinopril   . Sulfonamide Derivatives     Past Medical History  Diagnosis Date  . Aortic insufficiency   . Thoracic aortic aneurysm   . Hypertension   . Polymyositis   . Prostate cancer   . Diabetes  mellitus   . Urinary retention     Past Surgical History  Procedure Laterality Date  . Radioactive seed implant    . US echocardiography  12/05/2010    EF 55-65%  . Cardiovascular stress test  06/12/2000    EF 59%    Family History  Problem Relation Age of Onset  . Stroke Father   . Hypertension Sister   . Diabetes Sister   . Heart attack Brother   . Hypertension Brother     Social History:  reports that he has never smoked. He does not have any smokeless tobacco history on file. He reports that he does not drink alcohol or use illicit drugs.  Review of Systems   Anemia: Hemoglobin was 11.6 last month, previously has been nearly normal. He had to postpone his colonoscopy which was due recently because of other medical problems . Hemoccult was negative in 8/14   Low appetite, weight loss in the last few weeks, somewhat better now and weight is coming up  Has aortic regurgitation followed by cardiologist with CT scans every 2 years, was seen recently  He has had polymyositis followed by rheumatologist, CPK recently normal. No joint pains  HYPERLIPIDEMIA:         The lipid abnormality consists of  mildly elevated LDL controlled with simvastatin 20 mg.    History of benign prostatic hypertrophy followed by urologist; has had significant problems with urinary retention, urethral stricture   Examination:   BP 154/64  Pulse 88  Temp(Src) 97.9 F (36.6 C)  Resp 16  Ht 6\' 3"  (1.905 m)  Wt 190 lb 6.4 oz (86.365 kg)  BMI 23.80 kg/m2  SpO2 98%  Body mass index is 23.8 kg/(m^2).   152/60, 148/58 standing  No lymphadenopathy in the neck Abdominal exam is normal No pedal edema  Assesment:   ANEMIA: This is mild but his hemoglobin is relatively lower than usual This may be related to intercurrent illness but will recheck his hemoglobin and also iron saturation today. He is due to have his colonoscopy and he will do so whenever he can resolve other medical issues  Weight  loss, decreased  appetite: Most likely this is related to his significant problems with urinary difficulties requiring frequent interventions by urologist. Also he appears to have started regaining his weight recently Since he tends to have decreased appetite with Victoza will leave this off for now  HYPERTENSION: Blood pressure is  relatively higher although high readings are only occurring in the physician's office. He thinks blood pressure has been better at home Since blood pressure was relatively low with 40 mg Benicar will increase the amlodipine instead to 10 mg and continue Azor 5/20 Meanwhile he will continue to monitor at home  Diabetes type 2   His blood sugars are overall well controlled  He has had intercurrent illnesses which has caused weight loss and has not been as active as before As above he will leave off his Victoza because of his anorexia and weight loss Reassured him that mild increase in blood pressure would be reasonable at his age  Elayne Snare 04/20/2014, 8:29 AM

## 2014-04-20 NOTE — Patient Instructions (Addendum)
Stop Victoza  Amlodipine plus 5/20 Azor

## 2014-06-22 ENCOUNTER — Other Ambulatory Visit (INDEPENDENT_AMBULATORY_CARE_PROVIDER_SITE_OTHER): Payer: Medicare PPO

## 2014-06-22 DIAGNOSIS — M3320 Polymyositis, organ involvement unspecified: Secondary | ICD-10-CM

## 2014-06-22 DIAGNOSIS — IMO0001 Reserved for inherently not codable concepts without codable children: Secondary | ICD-10-CM

## 2014-06-22 DIAGNOSIS — I10 Essential (primary) hypertension: Secondary | ICD-10-CM

## 2014-06-22 DIAGNOSIS — E1165 Type 2 diabetes mellitus with hyperglycemia: Secondary | ICD-10-CM

## 2014-06-22 LAB — BASIC METABOLIC PANEL
BUN: 11 mg/dL (ref 6–23)
CO2: 26 mEq/L (ref 19–32)
Calcium: 9 mg/dL (ref 8.4–10.5)
Chloride: 103 mEq/L (ref 96–112)
Creatinine, Ser: 0.9 mg/dL (ref 0.4–1.5)
GFR: 111.51 mL/min (ref >60.00)
Glucose, Bld: 158 mg/dL — ABNORMAL HIGH (ref 70–99)
POTASSIUM: 3.8 meq/L (ref 3.5–5.1)
SODIUM: 137 meq/L (ref 135–145)

## 2014-06-22 LAB — CBC WITH DIFFERENTIAL/PLATELET
BASOS ABS: 0 10*3/uL (ref 0.0–0.1)
Basophils Relative: 0.6 % (ref 0.0–3.0)
EOS ABS: 0.1 10*3/uL (ref 0.0–0.7)
EOS PCT: 3.2 % (ref 0.0–5.0)
HCT: 34.2 % — ABNORMAL LOW (ref 39.0–52.0)
Hemoglobin: 11.4 g/dL — ABNORMAL LOW (ref 13.0–17.0)
LYMPHS ABS: 1.2 10*3/uL (ref 0.7–4.0)
Lymphocytes Relative: 26.8 % (ref 12.0–46.0)
MCHC: 33.4 g/dL (ref 30.0–36.0)
MCV: 95.2 fl (ref 78.0–100.0)
MONO ABS: 0.5 10*3/uL (ref 0.1–1.0)
Monocytes Relative: 11.7 % (ref 3.0–12.0)
NEUTROS PCT: 57.7 % (ref 43.0–77.0)
Neutro Abs: 2.5 10*3/uL (ref 1.4–7.7)
PLATELETS: 181 10*3/uL (ref 150.0–400.0)
RBC: 3.59 Mil/uL — ABNORMAL LOW (ref 4.22–5.81)
RDW: 15.7 % — AB (ref 11.5–15.5)
WBC: 4.4 10*3/uL (ref 4.0–10.5)

## 2014-06-22 LAB — CK: Total CK: 128 U/L (ref 7–232)

## 2014-06-22 LAB — HEMOGLOBIN A1C: Hgb A1c MFr Bld: 6.4 % (ref 4.6–6.5)

## 2014-06-27 ENCOUNTER — Other Ambulatory Visit: Payer: Self-pay | Admitting: Endocrinology

## 2014-06-28 ENCOUNTER — Ambulatory Visit (INDEPENDENT_AMBULATORY_CARE_PROVIDER_SITE_OTHER): Payer: Medicare PPO | Admitting: Endocrinology

## 2014-06-28 ENCOUNTER — Encounter: Payer: Self-pay | Admitting: Endocrinology

## 2014-06-28 VITALS — BP 128/64 | HR 68 | Temp 98.1°F | Resp 16 | Ht 74.0 in | Wt 192.6 lb

## 2014-06-28 DIAGNOSIS — E1165 Type 2 diabetes mellitus with hyperglycemia: Principal | ICD-10-CM

## 2014-06-28 DIAGNOSIS — I10 Essential (primary) hypertension: Secondary | ICD-10-CM

## 2014-06-28 DIAGNOSIS — D518 Other vitamin B12 deficiency anemias: Secondary | ICD-10-CM

## 2014-06-28 DIAGNOSIS — D519 Vitamin B12 deficiency anemia, unspecified: Secondary | ICD-10-CM

## 2014-06-28 DIAGNOSIS — IMO0001 Reserved for inherently not codable concepts without codable children: Secondary | ICD-10-CM

## 2014-06-28 NOTE — Patient Instructions (Signed)
Call when out of Victoza

## 2014-06-28 NOTE — Progress Notes (Signed)
Patient ID: Mark Daugherty, male   DOB: [birth year], 73 y.o.   MRN: [id number]   Reason for Appointment:  follow-up   History of Present Illness   Diagnosis: Type 2 DIABETES MELITUS, date of diagnosis: 1995         He has been on various combinations of medications previously for diabetes including Amaryl  and insulin which have been discontinued Because of higher readings at times and weight gain with excessive snacking he was switched from Januvia to  Victoza His A1c has been generally around 7% including in 3/15 Because of weight loss and decreased appetite he was told to leave off Victoza but he thinks blood sugars are starting to go up without it He has recently taken Victoza qod with a 0.6 mg dose because of decreased appetite and weight loss His blood sugars have been fairly good overall, relatively higher in the morning but not checking enough readings after supper He has also been more active since his last visit  Oral hypoglycemic drugs: metformin and Actos, previously was on Amaryl         Side effects from medications: nausea from 1.2 mg Victoza  Monitors blood glucose: Once a day.    Glucometer: One Touch.          Blood Glucose readings:   PREMEAL Breakfast Lunch Dinner Bedtime Overall  Glucose range:  101-133   64-113   82-100   186    Mean/median:  119     111   Meals: 3 meals per day.          Physical activity: exercise: regular yard work          [employer] from Last 3 Encounters:  06/28/14 192 lb 9.6 oz (87.363 kg)  04/20/14 190 lb 6.4 oz (86.365 kg)  03/22/14 194 lb 12.8 oz (88.361 kg)    Lab Results  Component Value Date   HGBA1C 6.4 06/22/2014   HGBA1C 7.1* 03/16/2014   HGBA1C 7.0* 01/04/2014   Lab Results  Component Value Date   MICROALBUR 6.9* 04/20/2014   LDLCALC 36 03/16/2014   CREATININE 0.9 06/22/2014     LABS:  Appointment on 06/22/2014  Component Date Value Ref Range Status  . WBC 06/22/2014 4.4  4.0 - 10.5 K/uL Final  . RBC 06/22/2014 3.59*  4.22 - 5.81 Mil/uL Final  . Hemoglobin 06/22/2014 11.4* 13.0 - 17.0 g/dL Final  . HCT 06/22/2014 34.2* 39.0 - 52.0 % Final  . MCV 06/22/2014 95.2  78.0 - 100.0 fl Final  . MCHC 06/22/2014 33.4  30.0 - 36.0 g/dL Final  . RDW 06/22/2014 15.7* 11.5 - 15.5 % Final  . Platelets 06/22/2014 181.0  150.0 - 400.0 K/uL Final  . Neutrophils Relative % 06/22/2014 57.7  43.0 - 77.0 % Final  . Lymphocytes Relative 06/22/2014 26.8  12.0 - 46.0 % Final  . Monocytes Relative 06/22/2014 11.7  3.0 - 12.0 % Final  . Eosinophils Relative 06/22/2014 3.2  0.0 - 5.0 % Final  . Basophils Relative 06/22/2014 0.6  0.0 - 3.0 % Final  . Neutro Abs 06/22/2014 2.5  1.4 - 7.7 K/uL Final  . Lymphs Abs 06/22/2014 1.2  0.7 - 4.0 K/uL Final  . Monocytes Absolute 06/22/2014 0.5  0.1 - 1.0 K/uL Final  . Eosinophils Absolute 06/22/2014 0.1  0.0 - 0.7 K/uL Final  . Basophils Absolute 06/22/2014 0.0  0.0 - 0.1 K/uL Final  . Sodium 06/22/2014 137  135 - 145 mEq/L Final  .  Potassium 06/22/2014 3.8  3.5 - 5.1 mEq/L Final  . Chloride 06/22/2014 103  96 - 112 mEq/L Final  . CO2 06/22/2014 26  19 - 32 mEq/L Final  . Glucose, Bld 06/22/2014 158* 70 - 99 mg/dL Final  . BUN 06/22/2014 11  6 - 23 mg/dL Final  . Creatinine, Ser 06/22/2014 0.9  0.4 - 1.5 mg/dL Final  . Calcium 06/22/2014 9.0  8.4 - 10.5 mg/dL Final  . GFR 06/22/2014 111.51  >60.00 mL/min Final  . Hemoglobin A1C 06/22/2014 6.4  4.6 - 6.5 % Final   Glycemic Control Guidelines for People with Diabetes:Non Diabetic:  <6%Goal of Therapy: <7%Additional Action Suggested:  >8%   . Total CK 06/22/2014 128  7 - 232 U/L Final      Medication List       This list is accurate as of: 06/28/14  8:55 AM.  Always use your most recent med list.               amLODipine 5 MG tablet  Commonly known as:  NORVASC  Take 1 tablet (5 mg total) by mouth daily.     amLODipine-olmesartan 5-20 MG per tablet  Commonly known as:  AZOR  Take 1 tablet by mouth daily.     aspirin 81 MG  tablet  Take 81 mg by mouth daily.     azathioprine 100 MG tablet  Commonly known as:  IMURAN  Take 100 mg by mouth daily.     carvedilol 25 MG tablet  Commonly known as:  COREG  Take 1 tablet (25 mg total) by mouth 2 (two) times daily with a meal.     finasteride 5 MG tablet  Commonly known as:  PROSCAR  Take 5 mg by mouth daily.     glucose blood test strip  Commonly known as:  ONE TOUCH ULTRA TEST  Use to check blood sugars 3 times per day     Insulin Pen Needle 32G X 6 MM Misc  Commonly known as:  NOVOFINE  Use daily with Victoza     Liraglutide 18 MG/3ML Sopn  Inject 0.6 mg into the skin daily. Injects 3 times per week     metFORMIN 500 MG tablet  Commonly known as:  GLUCOPHAGE  1 tablet in the am, and 2 tablets at night     multivitamin tablet  Take 1 tablet by mouth daily.     pioglitazone 30 MG tablet  Commonly known as:  ACTOS  Take 1 tablet (30 mg total) by mouth daily.     sildenafil 100 MG tablet  Commonly known as:  VIAGRA  Take 1 tablet (100 mg total) by mouth as needed.     simvastatin 20 MG tablet  Commonly known as:  ZOCOR  Take 1 tablet (20 mg total) by mouth at bedtime.        Allergies:  Allergies  Allergen Reactions  . Augmentin [Amoxicillin-Pot Clavulanate] Diarrhea  . Codeine   . Lisinopril   . Sulfonamide Derivatives     Past Medical History  Diagnosis Date  . Aortic insufficiency   . Thoracic aortic aneurysm   . Hypertension   . Polymyositis   . Prostate cancer   . Diabetes mellitus   . Urinary retention     Past Surgical History  Procedure Laterality Date  . Radioactive seed implant    . US echocardiography  12/05/2010    EF 55-65%  . Cardiovascular stress test  06/12/2000  EF 59%    Family History  Problem Relation Age of Onset  . Stroke Father   . Hypertension Sister   . Diabetes Sister   . Heart attack Brother   . Hypertension Brother     Social History:  reports that he has never smoked. He does not  have any smokeless tobacco history on file. He reports that he does not drink alcohol or use illicit drugs.  Review of Systems    HYPERTENSION:  recently systolic readings are in the 120s at home He was given additional 5 mg amlodipine but since blood pressure was low at home with this and he was having lightheadedness he stopped it He has had long-standing hypertension which is usually well controlled.   Anemia: Hemoglobin is still low but stable, previously has been nearly normal. Iron saturation was low normal and he is taking a supplement Also taking B12 long-term  He had to postpone his colonoscopy which was due recently because of other medical problems. Hemoccult was negative in 8/14   Has aortic regurgitation followed by cardiologist with CT scans every 2 years, was seen recently  He has had polymyositis followed by rheumatologist, CPK recently normal. No joint pains  HYPERLIPIDEMIA:         The lipid abnormality consists of  mildly elevated LDL controlled with simvastatin 20 mg.    History of benign prostatic hypertrophy followed by urologist; still has had significant problems with urinary retention, urethral stricture   Examination:   BP 128/64  Pulse 68  Temp(Src) 98.1 F (36.7 C)  Resp 16  Ht 6\' 2"  (1.88 m)  Wt 192 lb 9.6 oz (87.363 kg)  BMI 24.72 kg/m2  SpO2 99%  Body mass index is 24.72 kg/(m^2).   No ankle edema  Assesment:   ANEMIA: This is mild but not much improved with empirically starting iron supplements Most likely continued low hemoglobin is related to chronic disease He is due to have his colonoscopy and he will do so whenever he can resolve other medical issues   HYPERTENSION: Blood pressure is excellent now with current regimen He will continue to monitor at home  Diabetes type 2   His blood sugars are overall well controlled  Since he is taking subtherapeutic doses of Victoza will try him on Januvia on the next prescription for more  convenient administration  KUMAR,AJAY 06/28/2014, 8:55 AM

## 2014-07-06 ENCOUNTER — Ambulatory Visit (INDEPENDENT_AMBULATORY_CARE_PROVIDER_SITE_OTHER): Payer: Medicare PPO | Admitting: Pulmonary Disease

## 2014-07-06 ENCOUNTER — Encounter: Payer: Self-pay | Admitting: Pulmonary Disease

## 2014-07-06 VITALS — BP 142/80 | HR 77 | Temp 98.6°F | Ht 75.0 in | Wt 193.6 lb

## 2014-07-06 DIAGNOSIS — J8410 Pulmonary fibrosis, unspecified: Secondary | ICD-10-CM

## 2014-07-06 DIAGNOSIS — J849 Interstitial pulmonary disease, unspecified: Secondary | ICD-10-CM

## 2014-07-06 NOTE — Progress Notes (Signed)
   Subjective:    Patient ID: Mark Daugherty, male    DOB: 1941-05-16, 73 y.o.   MRN: [id number]  HPI Patient comes in today for followup of his known interstitial lung disease, secondary to polymyositis.  He is being treated with Imuran under the guidance rheumatology, and has done very well with this. He feels that his breathing is remaining on a stable baseline, and he is quite functional. He denies any significant cough or congestion.   Review of Systems  Constitutional: Negative for fever and unexpected weight change.  HENT: Negative for congestion, dental problem, ear pain, nosebleeds, postnasal drip, rhinorrhea, sinus pressure, sneezing, sore throat and trouble swallowing.   Eyes: Negative for redness and itching.  Respiratory: Negative for cough, chest tightness, shortness of breath and wheezing.   Cardiovascular: Negative for palpitations and leg swelling.  Gastrointestinal: Negative for nausea and vomiting.  Genitourinary: Negative for dysuria.  Musculoskeletal: Negative for joint swelling.  Skin: Negative for rash.  Neurological: Negative for headaches.  Hematological: Does not bruise/bleed easily.  Psychiatric/Behavioral: Negative for dysphoric mood. The patient is not nervous/anxious.        Objective:   Physical Exam Well-developed male in no acute distress Nose without purulence or discharge noted Neck without lymphadenopathy or thyromegaly Chest with minimal crackles in the bases, otherwise clear Cardiac exam with regular rate and rhythm, 2/6 systolic murmur Lower extremities without edema, no cyanosis Alert and oriented, moves all 4 extremities.       Assessment & Plan:

## 2014-07-06 NOTE — Patient Instructions (Signed)
Continue to stay active followup with me again in 40mos, and come a little early to get a chest xray before our visit on the same day.

## 2014-07-06 NOTE — Assessment & Plan Note (Signed)
The patient appears to be doing very well from an interstitial lung disease standpoint. His polymyositis is being treated by rheumatology with Imuran, and he has done very well on this. He feels that his breathing is at baseline, and has no significant cough or congestion. I've asked him to continue to stay active, and will see him back at his usual 6 months with a followup chest x-ray.

## 2014-08-02 ENCOUNTER — Other Ambulatory Visit: Payer: Self-pay | Admitting: *Deleted

## 2014-08-02 MED ORDER — AMLODIPINE-OLMESARTAN 5-20 MG PO TABS: 1.0000 | ORAL_TABLET | Freq: Every day | ORAL | Status: DC

## 2014-08-03 ENCOUNTER — Other Ambulatory Visit: Payer: Self-pay | Admitting: *Deleted

## 2014-08-03 ENCOUNTER — Telehealth: Payer: Self-pay | Admitting: Endocrinology

## 2014-08-03 MED ORDER — SITAGLIPTIN PHOSPHATE 100 MG PO TABS: 100.0000 mg | ORAL_TABLET | Freq: Every day | ORAL | Status: DC

## 2014-08-03 NOTE — Telephone Encounter (Signed)
NEEDS [place] TO CALL IN THE NEW SCRIPT FOR DIABETES WITH THE NEW MEDS HE IS THRU THE [facility]

## 2014-08-16 ENCOUNTER — Telehealth: Payer: Self-pay | Admitting: Endocrinology

## 2014-08-16 NOTE — Telephone Encounter (Signed)
If the blood sugars are not over 200 he can switch back to Victoza 0.6 mg a day instead of Januvia. May also after one week if tolerated dial the dose 5 clicks beyond the 0.6 mg dose if needed

## 2014-08-16 NOTE — Telephone Encounter (Signed)
Noted, patient is aware. 

## 2014-08-16 NOTE — Telephone Encounter (Signed)
Please see below and advise.

## 2014-08-16 NOTE — Telephone Encounter (Signed)
Patient being treated by a Urologist for prostate, the medication is causing high blood sugars to where he can not bring it down  Dr. Dwyane Dee prescribed patient Januvia   Please advise patient as he is concerned   Ph# [phone]   Thank you

## 2014-09-07 ENCOUNTER — Other Ambulatory Visit: Payer: Self-pay | Admitting: *Deleted

## 2014-09-07 MED ORDER — SIMVASTATIN 20 MG PO TABS: 20.0000 mg | ORAL_TABLET | Freq: Every day | ORAL | Status: AC

## 2014-09-09 ENCOUNTER — Ambulatory Visit (INDEPENDENT_AMBULATORY_CARE_PROVIDER_SITE_OTHER): Payer: Medicare PPO | Admitting: Cardiology

## 2014-09-09 ENCOUNTER — Encounter: Payer: Self-pay | Admitting: Cardiology

## 2014-09-09 VITALS — BP 110/56 | HR 73 | Ht 75.0 in | Wt 189.4 lb

## 2014-09-09 DIAGNOSIS — I10 Essential (primary) hypertension: Secondary | ICD-10-CM

## 2014-09-09 DIAGNOSIS — I712 Thoracic aortic aneurysm, without rupture, unspecified: Secondary | ICD-10-CM

## 2014-09-09 DIAGNOSIS — I359 Nonrheumatic aortic valve disorder, unspecified: Secondary | ICD-10-CM

## 2014-09-09 DIAGNOSIS — E785 Hyperlipidemia, unspecified: Secondary | ICD-10-CM

## 2014-09-09 DIAGNOSIS — I351 Nonrheumatic aortic (valve) insufficiency: Secondary | ICD-10-CM

## 2014-09-09 NOTE — Patient Instructions (Signed)
Continue your current therapy  I will see you in 6 months.   

## 2014-09-09 NOTE — Progress Notes (Signed)
Mark Daugherty Date of Birth: 06-26-1941   History of Present Illness: Mark Daugherty is seen today for followup. He has a history of thoracic aortic aneurysm and chronic aortic insufficiency which is moderate to severe. He has a history of polymyositis. He also has a history of diabetes and prostate cancer. He has been followed here since 2004 and his echocardiograms and CT scans have been stable. His last evaluation was in November 2013. He reports that he is doing very well. He denies any significant chest pain, shortness of breath, or palpitations. He's had no edema.  On prior visit amlodipine dose was increased but this may him lightheaded and he couldn't function. BP controlled on current meds. Still has difficulty voiding and self caths. He does note some tiredness. No arthralgias. Breathing is doing OK.  Current Outpatient Prescriptions on File Prior to Visit  Medication Sig Dispense Refill  . amLODipine-olmesartan (AZOR) 5-20 MG per tablet Take 1 tablet by mouth daily.  90 tablet  1  . aspirin 81 MG tablet Take 81 mg by mouth daily.      Marland Kitchen azathioprine (IMURAN) 100 MG tablet Take 100 mg by mouth daily.        . carvedilol (COREG) 25 MG tablet Take 1 tablet (25 mg total) by mouth 2 (two) times daily with a meal.  180 tablet  3  . Cholecalciferol (VITAMIN D-3) 1000 UNITS CAPS Take by mouth.      . Cyanocobalamin (VITAMIN B 12 PO) Take 1,000 Units by mouth.      . ferrous sulfate 325 (65 FE) MG tablet Take 325 mg by mouth daily with breakfast.      . finasteride (PROSCAR) 5 MG tablet Take 5 mg by mouth daily.        Marland Kitchen glucose blood (ONE TOUCH ULTRA TEST) test strip Use to check blood sugars 3 times per day  300 each  3  . Insulin Pen Needle (NOVOFINE) 32G X 6 MM MISC Use daily with Victoza  50 each  5  . Liraglutide 18 MG/3ML SOPN Inject 0.6 mg into the skin daily. Injects 3 times per week      . metFORMIN (GLUCOPHAGE) 500 MG tablet 1 tablet in the am, and 2 tablets at night  90 tablet  5   . Multiple Vitamin (MULTIVITAMIN) tablet Take 1 tablet by mouth daily.        . pioglitazone (ACTOS) 30 MG tablet TAKE ONE TABLET BY MOUTH ONCE DAILY  90 tablet  1  . simvastatin (ZOCOR) 20 MG tablet Take 1 tablet (20 mg total) by mouth at bedtime.  90 tablet  3   No current facility-administered medications on file prior to visit.    Allergies  Allergen Reactions  . Augmentin [Amoxicillin-Pot Clavulanate] Diarrhea  . Codeine   . Lisinopril   . Sulfonamide Derivatives     Past Medical History  Diagnosis Date  . Aortic insufficiency   . Thoracic aortic aneurysm   . Hypertension   . Polymyositis   . Prostate cancer   . Diabetes mellitus   . Urinary retention     Past Surgical History  Procedure Laterality Date  . Radioactive seed implant    . US echocardiography  12/05/2010    EF 55-65%  . Cardiovascular stress test  06/12/2000    EF 59%    History  Smoking status  . Never Smoker   Smokeless tobacco  . Not on file    History  Alcohol Use  No    Family History  Problem Relation Age of Onset  . Stroke Father   . Hypertension Sister   . Diabetes Sister   . Heart attack Brother   . Hypertension Brother     Review of Systems: As noted in HPI.  All other systems were reviewed and are negative.  Physical Exam: BP 110/56  Pulse 73  Ht 6\' 3"  (1.905 m)  Wt 189 lb 6.4 oz (85.911 kg)  BMI 23.67 kg/m2 He is a pleasant black male in no acute distress. HEENT is normal.  Neck is supple without JVD, adenopathy, thyromegaly, or bruits. Lungs are clear. Cardiac exam reveals a regular rate and rhythm with a grade 2/6 systolic ejection murmur at the right upper sternal border. There is a very soft early diastolic murmur. He has no S3. Abdomen is soft and nontender without masses or bruits. He has good pedal pulses. There is no edema. He is alert and oriented x3. Cranial nerves II through XII are intact.  Laboratory data:  Joretta Bachelor, MD 11/18/2012        Narrative        *RADIOLOGY REPORT*  Clinical Data: Follow up of known thoracic aortic aneurysm  CT ANGIOGRAPHY CHEST  Technique: Multidetector CT imaging of the chest using the standard protocol during bolus administration of intravenous contrast. Multiplanar reconstructed images including MIPs were obtained and reviewed to evaluate the vascular anatomy.  Contrast: 138mL OMNIPAQUE IOHEXOL 350 MG/ML SOLN  Comparison: CT chest of 12/05/2010  Findings: There are primarily interstitial opacities at the lung bases with some mild bronchiectatic change consistent with interstitial lung disease. An active component is difficult to exclude. There also are slightly more prominent interstitial markings throughout the remainder of the lungs. There are a few small nodular opacities within the right upper lobe which are new and continued follow-up is recommended. No pleural effusion is seen. An element of interstitial edema is difficult to exclude.  On soft tissue window images, the thyroid gland is unremarkable. Fusiform dilatation of the thoracic aorta is relatively stable measuring 5.0 x 4.5 cm compared to 4.9 x 4.8 cm previously. No acute thoracic abnormality is evident. The aortic root also is dilated measuring up to 4.6 cm. The thoracic arch is normal in caliber as is the descending thoracic aorta. Cardiomegaly is stable. The pulmonary arteries opacify with no acute abnormality noted. The upper abdomen is unremarkable.  IMPRESSION:  1. Increase in basilar interstitial opacities with some bronchiectatic change most consistent with chronic interstitial lung disease. An active component is difficult to exclude. 2. Small nodular and peribronchovascular opacities in the right upper lobe which appear new. Recommend continued follow-up CT chest in 4-6 months. 3. Cannot exclude an element of mild interstitial edema. 4. Relatively stable fusiform dilatation of the ascending aorta with maximal  diameter of 5.0 cm compared to 4.9 cm previously. Cardiomegaly.   Original Report Authenticated By: Ivar Drape, M.D.       Echo:Study Conclusions  - Left ventricle: The cavity size was normal. Wall thickness was increased in a pattern of mild LVH. The estimated ejection fraction was 55%. Wall motion was normal; there were no regional wall motion abnormalities. Doppler parameters are consistent with abnormal left ventricular relaxation (grade 1 diastolic dysfunction). Internal dimension:60mm (ED, chordal level, PLAX). - Aortic valve: There was no stenosis. Moderate to severe regurgitation. Regurgitation pressure half-time: 330ms. - Aorta: Dilated aortic root and ascending aorta. Visualized ascending aorta measured 4.9 cm. - Mitral valve: Mild regurgitation. -  Left atrium: The atrium was mildly dilated. - Right ventricle: The cavity size was normal. Systolic function was normal. - Tricuspid valve: Peak RV-RA gradient: 46mm Hg (S). - Pulmonary arteries: PA peak pressure: 75mm Hg (S). - Inferior vena cava: The vessel was normal in size; the respirophasic diameter changes were in the normal range (= 50%); findings are consistent with normal central venous pressure. Impressions:  - Normal LV size with mild LV hypertrophy, EF 55%. The aortic valve was trileaflet with no stenosis and moderate to severe regurgitation. The aortic root and ascending aorta wereaneurysmal, see above for dimensions. Normal RV size and systolic function with mild pulmonary hypertension.  Ecg today NSR rate 73 bpm. Normal.  Assessment / Plan: 1. Thoracic aortic aneurysm. 5.0 cm. Size is stable by CT. We have been following this every 2 years with stable exams. Will consider follow up CT next visit. He is asymptomatic.  2. Chronic aortic insufficiency, moderate to severe. Patient is asymptomatic. Follow up Echo next visit.  3. Hypertension. Well controlled. Continue current therapy.  4. Polymyositis  with pulmonary involvement. On chronic Imuran.  5. Diabetes mellitus type 2.  6. Urinary retention. S/p radioactive seed implant for prostate CA.

## 2014-09-17 ENCOUNTER — Other Ambulatory Visit: Payer: Self-pay | Admitting: *Deleted

## 2014-09-17 MED ORDER — LIRAGLUTIDE 18 MG/3ML ~~LOC~~ SOPN: PEN_INJECTOR | SUBCUTANEOUS | Status: DC

## 2014-09-21 ENCOUNTER — Other Ambulatory Visit: Payer: Self-pay | Admitting: *Deleted

## 2014-09-21 ENCOUNTER — Telehealth: Payer: Self-pay | Admitting: Endocrinology

## 2014-09-21 DIAGNOSIS — IMO0001 Reserved for inherently not codable concepts without codable children: Secondary | ICD-10-CM

## 2014-09-21 DIAGNOSIS — R5383 Other fatigue: Principal | ICD-10-CM

## 2014-09-21 DIAGNOSIS — I10 Essential (primary) hypertension: Secondary | ICD-10-CM

## 2014-09-21 DIAGNOSIS — R5381 Other malaise: Secondary | ICD-10-CM

## 2014-09-21 NOTE — Telephone Encounter (Signed)
Patient would like for you to call him, before he take his blood test Thursday.

## 2014-09-23 ENCOUNTER — Other Ambulatory Visit (INDEPENDENT_AMBULATORY_CARE_PROVIDER_SITE_OTHER): Payer: Medicare PPO

## 2014-09-23 DIAGNOSIS — E1165 Type 2 diabetes mellitus with hyperglycemia: Principal | ICD-10-CM

## 2014-09-23 DIAGNOSIS — IMO0001 Reserved for inherently not codable concepts without codable children: Secondary | ICD-10-CM

## 2014-09-23 DIAGNOSIS — D518 Other vitamin B12 deficiency anemias: Secondary | ICD-10-CM

## 2014-09-23 DIAGNOSIS — D519 Vitamin B12 deficiency anemia, unspecified: Secondary | ICD-10-CM

## 2014-09-23 DIAGNOSIS — R5383 Other fatigue: Secondary | ICD-10-CM

## 2014-09-23 DIAGNOSIS — I10 Essential (primary) hypertension: Secondary | ICD-10-CM

## 2014-09-23 DIAGNOSIS — R5381 Other malaise: Secondary | ICD-10-CM

## 2014-09-23 LAB — CBC WITH DIFFERENTIAL/PLATELET
BASOS ABS: 0 10*3/uL (ref 0.0–0.1)
Basophils Relative: 0.4 % (ref 0.0–3.0)
Eosinophils Absolute: 0.2 10*3/uL (ref 0.0–0.7)
Eosinophils Relative: 3.9 % (ref 0.0–5.0)
HCT: 33.7 % — ABNORMAL LOW (ref 39.0–52.0)
HEMOGLOBIN: 11.2 g/dL — AB (ref 13.0–17.0)
Lymphocytes Relative: 20.8 % (ref 12.0–46.0)
Lymphs Abs: 1.1 10*3/uL (ref 0.7–4.0)
MCHC: 33.2 g/dL (ref 30.0–36.0)
MCV: 93.4 fl (ref 78.0–100.0)
MONOS PCT: 11.7 % (ref 3.0–12.0)
Monocytes Absolute: 0.6 10*3/uL (ref 0.1–1.0)
NEUTROS ABS: 3.4 10*3/uL (ref 1.4–7.7)
Neutrophils Relative %: 63.2 % (ref 43.0–77.0)
Platelets: 193 10*3/uL (ref 150.0–400.0)
RBC: 3.61 Mil/uL — AB (ref 4.22–5.81)
RDW: 14.4 % (ref 11.5–15.5)
WBC: 5.4 10*3/uL (ref 4.0–10.5)

## 2014-09-23 LAB — COMPREHENSIVE METABOLIC PANEL
ALT: 11 U/L (ref 0–53)
AST: 19 U/L (ref 0–37)
Albumin: 3.6 g/dL (ref 3.5–5.2)
Alkaline Phosphatase: 38 U/L — ABNORMAL LOW (ref 39–117)
BUN: 14 mg/dL (ref 6–23)
CO2: 28 meq/L (ref 19–32)
CREATININE: 0.9 mg/dL (ref 0.4–1.5)
Calcium: 9.2 mg/dL (ref 8.4–10.5)
Chloride: 104 mEq/L (ref 96–112)
GFR: 107.11 mL/min (ref >60.00)
Glucose, Bld: 182 mg/dL — ABNORMAL HIGH (ref 70–99)
Potassium: 4.4 mEq/L (ref 3.5–5.1)
Sodium: 136 mEq/L (ref 135–145)
Total Bilirubin: 0.2 mg/dL (ref 0.2–1.2)
Total Protein: 7.2 g/dL (ref 6.0–8.3)

## 2014-09-23 LAB — VITAMIN B12: Vitamin B-12: 1101 pg/mL — ABNORMAL HIGH (ref 211–911)

## 2014-09-23 LAB — CK: Total CK: 106 U/L (ref 7–232)

## 2014-09-23 LAB — HEMOGLOBIN A1C: Hgb A1c MFr Bld: 8.4 % — ABNORMAL HIGH (ref 4.6–6.5)

## 2014-09-24 LAB — COMPLETE METABOLIC PANEL WITH GFR
ALBUMIN: 3.8 g/dL (ref 3.5–5.2)
ALT: 10 U/L (ref 0–53)
AST: 14 U/L (ref 0–37)
Alkaline Phosphatase: 39 U/L (ref 39–117)
BUN: 13 mg/dL (ref 6–23)
CALCIUM: 9.4 mg/dL (ref 8.4–10.5)
CHLORIDE: 103 meq/L (ref 96–112)
CO2: 26 meq/L (ref 19–32)
CREATININE: 0.92 mg/dL (ref 0.50–1.35)
GFR, Est Non African American: 81 mL/min
Glucose, Bld: 179 mg/dL — ABNORMAL HIGH (ref 70–99)
POTASSIUM: 4.4 meq/L (ref 3.5–5.3)
Sodium: 136 mEq/L (ref 135–145)
Total Bilirubin: 0.3 mg/dL (ref 0.2–1.2)
Total Protein: 6.9 g/dL (ref 6.0–8.3)

## 2014-09-28 ENCOUNTER — Encounter: Payer: Self-pay | Admitting: Endocrinology

## 2014-09-28 ENCOUNTER — Ambulatory Visit (INDEPENDENT_AMBULATORY_CARE_PROVIDER_SITE_OTHER): Payer: Medicare PPO | Admitting: Endocrinology

## 2014-09-28 VITALS — BP 130/62 | Wt 192.6 lb

## 2014-09-28 DIAGNOSIS — R5381 Other malaise: Secondary | ICD-10-CM

## 2014-09-28 DIAGNOSIS — IMO0001 Reserved for inherently not codable concepts without codable children: Secondary | ICD-10-CM

## 2014-09-28 DIAGNOSIS — I10 Essential (primary) hypertension: Secondary | ICD-10-CM

## 2014-09-28 DIAGNOSIS — R5383 Other fatigue: Secondary | ICD-10-CM

## 2014-09-28 DIAGNOSIS — D638 Anemia in other chronic diseases classified elsewhere: Secondary | ICD-10-CM

## 2014-09-28 DIAGNOSIS — E1165 Type 2 diabetes mellitus with hyperglycemia: Secondary | ICD-10-CM

## 2014-09-28 DIAGNOSIS — R531 Weakness: Secondary | ICD-10-CM

## 2014-09-28 MED ORDER — GLIMEPIRIDE 1 MG PO TABS: 1.0000 mg | ORAL_TABLET | Freq: Every day | ORAL | Status: DC

## 2014-09-28 NOTE — Progress Notes (Signed)
Patient ID: Mark Daugherty, male   DOB: February 13, 1941, 73 y.o.   MRN: [id number]   Reason for Appointment:  follow-up   History of Present Illness   Chief complaint: Weakness and dizziness  He thinks that since he started Lupron injections in July or August he has had feelings of tiredness, weakness and unable to do much physical activity. Sometimes when he will get up to stand he will feel lightheaded and faint for a couple of minutes and may need to sit down He has checked his blood pressure including on standing and has not had any low readings, lowest reading about 631 systolic Also blood pressure with other physicians and has been normal He has not had any change in appetite, no change in weight, no shortness of breath, no aches or pains and no GI symptoms He has discussed this with his urologist and has not taken an injection recently  Problem 2:  Type 2 DIABETES MELITUS, date of diagnosis: 1995         He has been on various combinations of medications previously for diabetes including Amaryl  and insulin which have been discontinued Because of higher readings at times and weight gain with excessive snacking he was switched from Januvia to  [place] His A1c has been generally around 7% but significantly higher now Because of weight loss and decreased appetite he was told to leave off Victoza but he thinks blood sugars are starting to go up without it He has recently taken Victoza qd with a 0.9 mg dose and has tolerated this since his last visit His blood sugars have been overall higher especially in the mornings and sporadically later in the day  Oral hypoglycemic drugs: metformin and Actos, previously was on Amaryl         Side effects from medications: nausea from 1.2 mg Victoza  Monitors blood glucose:  2 times a day.    Glucometer: One Touch.          Blood Glucose readings:   PREMEAL Breakfast Lunch Dinner Bedtime Overall  Glucose range:  124-175   104-161   91-171   149, 1 74     Mean/median:  150      146     Meals: 3 meals per day.          Physical activity: exercise: None recently        Wt Readings from Last 3 Encounters:  09/28/14 192 lb 9.6 oz (87.363 kg)  09/09/14 189 lb 6.4 oz (85.911 kg)  07/06/14 193 lb 9.6 oz (87.816 kg)    Lab Results  Component Value Date   HGBA1C 8.4* 09/23/2014   HGBA1C 6.4 06/22/2014   HGBA1C 7.1* 03/16/2014   Lab Results  Component Value Date   MICROALBUR 6.9* 04/20/2014   LDLCALC 36 03/16/2014   CREATININE 0.9 09/23/2014   CREATININE 0.92 09/23/2014     LABS:  Appointment on 09/23/2014  Component Date Value Ref Range Status  . Hemoglobin A1C 09/23/2014 8.4* 4.6 - 6.5 % Final   Glycemic Control Guidelines for People with Diabetes:Non Diabetic:  <6%Goal of Therapy: <7%Additional Action Suggested:  >8%   . Sodium 09/23/2014 136  135 - 145 mEq/L Final  . Potassium 09/23/2014 4.4  3.5 - 5.1 mEq/L Final  . Chloride 09/23/2014 104  96 - 112 mEq/L Final  . CO2 09/23/2014 28  19 - 32 mEq/L Final  . Glucose, Bld 09/23/2014 182* 70 - 99 mg/dL Final  . BUN 09/23/2014 14  6 - 23 mg/dL Final  . Creatinine, Ser 09/23/2014 0.9  0.4 - 1.5 mg/dL Final  . Total Bilirubin 09/23/2014 0.2  0.2 - 1.2 mg/dL Final  . Alkaline Phosphatase 09/23/2014 38* 39 - 117 U/L Final  . AST 09/23/2014 19  0 - 37 U/L Final  . ALT 09/23/2014 11  0 - 53 U/L Final  . Total Protein 09/23/2014 7.2  6.0 - 8.3 g/dL Final  . Albumin 09/23/2014 3.6  3.5 - 5.2 g/dL Final  . Calcium 09/23/2014 9.2  8.4 - 10.5 mg/dL Final  . GFR 09/23/2014 107.11  >60.00 mL/min Final  . Vitamin B-12 09/23/2014 1101* 211 - 911 pg/mL Final  . WBC 09/23/2014 5.4  4.0 - 10.5 K/uL Final  . RBC 09/23/2014 3.61* 4.22 - 5.81 Mil/uL Final  . Hemoglobin 09/23/2014 11.2* 13.0 - 17.0 g/dL Final  . HCT 09/23/2014 33.7* 39.0 - 52.0 % Final  . MCV 09/23/2014 93.4  78.0 - 100.0 fl Final  . MCHC 09/23/2014 33.2  30.0 - 36.0 g/dL Final  . RDW 09/23/2014 14.4  11.5 - 15.5 % Final  .  Platelets 09/23/2014 193.0  150.0 - 400.0 K/uL Final  . Neutrophils Relative % 09/23/2014 63.2  43.0 - 77.0 % Final  . Lymphocytes Relative 09/23/2014 20.8  12.0 - 46.0 % Final  . Monocytes Relative 09/23/2014 11.7  3.0 - 12.0 % Final  . Eosinophils Relative 09/23/2014 3.9  0.0 - 5.0 % Final  . Basophils Relative 09/23/2014 0.4  0.0 - 3.0 % Final  . Neutro Abs 09/23/2014 3.4  1.4 - 7.7 K/uL Final  . Lymphs Abs 09/23/2014 1.1  0.7 - 4.0 K/uL Final  . Monocytes Absolute 09/23/2014 0.6  0.1 - 1.0 K/uL Final  . Eosinophils Absolute 09/23/2014 0.2  0.0 - 0.7 K/uL Final  . Basophils Absolute 09/23/2014 0.0  0.0 - 0.1 K/uL Final  . Sodium 09/23/2014 136  135 - 145 mEq/L Final  . Potassium 09/23/2014 4.4  3.5 - 5.3 mEq/L Final  . Chloride 09/23/2014 103  96 - 112 mEq/L Final  . CO2 09/23/2014 26  19 - 32 mEq/L Final  . Glucose, Bld 09/23/2014 179* 70 - 99 mg/dL Final  . BUN 09/23/2014 13  6 - 23 mg/dL Final  . Creat 09/23/2014 0.92  0.50 - 1.35 mg/dL Final  . Total Bilirubin 09/23/2014 0.3  0.2 - 1.2 mg/dL Final  . Alkaline Phosphatase 09/23/2014 39  39 - 117 U/L Final  . AST 09/23/2014 14  0 - 37 U/L Final  . ALT 09/23/2014 10  0 - 53 U/L Final  . Total Protein 09/23/2014 6.9  6.0 - 8.3 g/dL Final  . Albumin 09/23/2014 3.8  3.5 - 5.2 g/dL Final  . Calcium 09/23/2014 9.4  8.4 - 10.5 mg/dL Final  . GFR, Est African American 09/23/2014 >89   Final  . GFR, Est Non African American 09/23/2014 81   Final   Comment:                            The estimated GFR is a calculation valid for adults (>=80 years old)                          that uses the CKD-EPI algorithm to adjust for age and sex. It is  not to be used for children, pregnant women, hospitalized patients,                             patients on dialysis, or with rapidly changing kidney function.                          According to the NKDEP, eGFR >89 is normal, 60-89 shows mild                           impairment, 30-59 shows moderate impairment, 15-29 shows severe                          impairment and <15 is ESRD.                             Marland Kitchen Total CK 09/23/2014 106  7 - 232 U/L Final      Medication List       This list is accurate as of: 09/28/14  9:04 AM.  Always use your most recent med list.               amLODipine-olmesartan 5-20 MG per tablet  Commonly known as:  AZOR  Take 1 tablet by mouth daily.     aspirin 81 MG tablet  Take 81 mg by mouth daily.     azathioprine 100 MG tablet  Commonly known as:  IMURAN  Take 100 mg by mouth daily.     bicalutamide 50 MG tablet  Commonly known as:  CASODEX  Take 1 tablet by mouth daily.     carvedilol 25 MG tablet  Commonly known as:  COREG  Take 1 tablet (25 mg total) by mouth 2 (two) times daily with a meal.     ferrous sulfate 325 (65 FE) MG tablet  Take 325 mg by mouth daily with breakfast.     finasteride 5 MG tablet  Commonly known as:  PROSCAR  Take 5 mg by mouth daily.     glucose blood test strip  Commonly known as:  ONE TOUCH ULTRA TEST  Use to check blood sugars 3 times per day     Insulin Pen Needle 32G X 6 MM Misc  Commonly known as:  NOVOFINE  Use daily with Victoza     Liraglutide 18 MG/3ML Sopn  Inject 1.2 mg daily     LUPRON IJ  Inject as directed every 30 (thirty) days.     medroxyPROGESTERone 5 MG tablet  Commonly known as:  PROVERA  Take 1 tablet by mouth 2 (two) times daily.     metFORMIN 500 MG tablet  Commonly known as:  GLUCOPHAGE  1 tablet in the am, and 2 tablets at night     multivitamin tablet  Take 1 tablet by mouth daily.     pioglitazone 30 MG tablet  Commonly known as:  ACTOS  TAKE ONE TABLET BY MOUTH ONCE DAILY     simvastatin 20 MG tablet  Commonly known as:  ZOCOR  Take 1 tablet (20 mg total) by mouth at bedtime.     VITAMIN B 12 PO  Take 1,000 Units by mouth.     Vitamin D-3 1000 UNITS Caps  Take by mouth.        Allergies:  Allergies  Allergen  Reactions  . Augmentin [Amoxicillin-Pot Clavulanate] Diarrhea  . Codeine   . Lisinopril   . Sulfonamide Derivatives     Past Medical History  Diagnosis Date  . Aortic insufficiency   . Thoracic aortic aneurysm   . Hypertension   . Polymyositis   . Prostate cancer   . Diabetes mellitus   . Urinary retention     Past Surgical History  Procedure Laterality Date  . Radioactive seed implant    . US echocardiography  12/05/2010    EF 55-65%  . Cardiovascular stress test  06/12/2000    EF 59%    Family History  Problem Relation Age of Onset  . Stroke Father   . Hypertension Sister   . Diabetes Sister   . Heart attack Brother   . Hypertension Brother     Social History:  reports that he has never smoked. He does not have any smokeless tobacco history on file. He reports that he does not drink alcohol or use illicit drugs.  Review of Systems   HYPERTENSION:  recently systolic readings are ranging from 120-130 usually He has had long-standing hypertension which is usually well controlled.  Anemia: Hemoglobin is still low below 12 but stable, previously has been nearly normal. Iron saturation was low normal and he is taking a supplement Also taking B12 long-term  He had to postpone his colonoscopy which was due recently because of other medical problems. Hemoccult was negative in 8/14   Has aortic regurgitation followed by cardiologist with CT scans every 2 years, was seen recently  He has had polymyositis followed by rheumatologist, CPK recently normal. No joint pains  HYPERLIPIDEMIA:         The lipid abnormality consists of  mildly elevated LDL controlled with simvastatin 20 mg.    History of benign prostatic hypertrophy followed by urologist; has recurrence of his prostate cancer He still has had significant problems with urinary retention, urethral stricture and is doing self-catheterization now   Examination:   BP 148/56  Wt 192 lb 9.6 oz (87.363 kg)  Body mass  index is 24.07 kg/(m^2).   No ankle edema  Standing blood pressure 130/62  Pulse regular  Assesment:   WEAKNESS/dizziness: Etiology of this is unclear he does not have any change in blood pressure or new onset of anemia or systemic problems. He appears to have had symptoms since he was treated for his prostate cancer and he will hold off on his Lupron injections for now   ANEMIA: This is mild but not much improved with empirically starting iron supplements Most likely continued low hemoglobin is related to chronic disease He is due to have his colonoscopy and he will do so whenever he can resolve other medical issues   HYPERTENSION: Blood pressure is  controlled now with current regimen He will continue to monitor at home And report any significant change  Diabetes type 2   His blood sugars are overall  worse and A1c is over 8% He will be given Amaryl in addition to his current regimen and will continue the Victoza 0.9 mg dose which he is tolerating   He should be able to resume his exercise once she is subjectively feeling better  Britlyn Martine 09/28/2014, 9:04 AM

## 2014-09-28 NOTE — Patient Instructions (Signed)
Glimeperide before supper  May reduce Victoza to 0.6 if getting low

## 2014-09-30 ENCOUNTER — Other Ambulatory Visit: Payer: Self-pay | Admitting: *Deleted

## 2014-09-30 ENCOUNTER — Other Ambulatory Visit: Payer: Medicare PPO

## 2014-09-30 DIAGNOSIS — E1165 Type 2 diabetes mellitus with hyperglycemia: Secondary | ICD-10-CM

## 2014-09-30 DIAGNOSIS — IMO0002 Reserved for concepts with insufficient information to code with codable children: Secondary | ICD-10-CM

## 2014-09-30 LAB — MICROALBUMIN / CREATININE URINE RATIO
Creatinine,U: 65.8 mg/dL
MICROALB UR: 1.7 mg/dL (ref 0.0–1.9)
MICROALB/CREAT RATIO: 2.6 mg/g (ref 0.0–30.0)

## 2014-10-02 ENCOUNTER — Other Ambulatory Visit: Payer: Self-pay | Admitting: Endocrinology

## 2014-10-12 ENCOUNTER — Other Ambulatory Visit: Payer: Self-pay | Admitting: Endocrinology

## 2014-11-01 ENCOUNTER — Encounter: Payer: Self-pay | Admitting: Endocrinology

## 2014-11-01 ENCOUNTER — Ambulatory Visit (INDEPENDENT_AMBULATORY_CARE_PROVIDER_SITE_OTHER): Payer: Medicare PPO | Admitting: Endocrinology

## 2014-11-01 VITALS — BP 138/64 | HR 79 | Temp 98.7°F | Resp 16 | Ht 75.0 in | Wt 198.0 lb

## 2014-11-01 DIAGNOSIS — IMO0002 Reserved for concepts with insufficient information to code with codable children: Secondary | ICD-10-CM

## 2014-11-01 DIAGNOSIS — Z23 Encounter for immunization: Secondary | ICD-10-CM

## 2014-11-01 DIAGNOSIS — E1165 Type 2 diabetes mellitus with hyperglycemia: Secondary | ICD-10-CM

## 2014-11-01 MED ORDER — SITAGLIPTIN PHOSPHATE 100 MG PO TABS: 100.0000 mg | ORAL_TABLET | Freq: Every day | ORAL | Status: AC

## 2014-11-01 NOTE — Progress Notes (Addendum)
Patient ID: Mark Daugherty, male   DOB: 1941-08-08, 73 y.o.   MRN: [id number]   Reason for Appointment:  follow-up of diabetes  History of Present Illness   Chief complaint:    Type 2 DIABETES MELITUS, date of diagnosis: 1995         He has been on various combinations of medications previously for diabetes including Amaryl  and insulin which have been discontinued Because of higher readings at times and weight gain with excessive snacking he was switched from Januvia to  [place] His A1c has been generally around 7% but significantly higher in 9/15  Because of weight loss and decreased appetite he was told to leave off Victoza but this was resumed in 9/15 when he was having higher readings However he continued to have high fasting blood sugars and was concerned about it Amaryl 1 mg was added at suppertime With this his blood sugars are improved although he may have relatively low sugars around lunchtime He has taken Victoza qd with a 0.9 mg dose but is asking about possible constipation from this and wants to change His blood sugars have been overall fairly good later in the day but has sporadic high postprandial readings, some related to higher carbohydrate intake He also has gained significant amount of weight  Oral hypoglycemic drugs: metformin and Actos, was on Amaryl         Side effects from medications: nausea from 1.2 mg Victoza  Monitors blood glucose:  2 times a day.    Glucometer: One Touch.          Blood Glucose readings:   PREMEAL Breakfast Lunch Dinner Bedtime Overall  Glucose range: 95-129 68-152 78- 184 125-178   median: 114    117   Meals: 3 meals per day, breakfast is usually around 10:30 AM and usually has a meat for protein.          Physical activity: exercise: None recently        Wt Readings from Last 3 Encounters:  11/01/14 198 lb (89.812 kg)  09/28/14 192 lb 9.6 oz (87.363 kg)  09/09/14 189 lb 6.4 oz (85.911 kg)    Lab Results  Component Value Date   HGBA1C 8.4* 09/23/2014   HGBA1C 6.4 06/22/2014   HGBA1C 7.1* 03/16/2014   Lab Results  Component Value Date   MICROALBUR 1.7 09/30/2014   LDLCALC 36 03/16/2014   CREATININE 0.9 09/23/2014   CREATININE 0.92 09/23/2014     LABS:  No visits with results within 1 Week(s) from this visit. Latest known visit with results is:  Appointment on 09/30/2014  Component Date Value Ref Range Status  . Microalb, Ur 09/30/2014 1.7  0.0 - 1.9 mg/dL Final  . Creatinine,U 09/30/2014 65.8   Final  . Microalb Creat Ratio 09/30/2014 2.6  0.0 - 30.0 mg/g Final      Medication List       This list is accurate as of: 11/01/14  4:46 PM.  Always use your most recent med list.               amLODipine-olmesartan 5-20 MG per tablet  Commonly known as:  AZOR  Take 1 tablet by mouth daily.     aspirin 81 MG tablet  Take 81 mg by mouth daily.     azathioprine 100 MG tablet  Commonly known as:  IMURAN  Take 100 mg by mouth daily.     bicalutamide 50 MG tablet  Commonly known as:  CASODEX  Take 1 tablet by mouth daily.     carvedilol 25 MG tablet  Commonly known as:  COREG  TAKE ONE TABLET BY MOUTH TWICE DAILY WITH MEALS     ferrous sulfate 325 (65 FE) MG tablet  Take 325 mg by mouth daily with breakfast.     finasteride 5 MG tablet  Commonly known as:  PROSCAR  Take 5 mg by mouth daily.     glimepiride 1 MG tablet  Commonly known as:  AMARYL  Take 1 tablet (1 mg total) by mouth daily before supper.     glucose blood test strip  Commonly known as:  ONE TOUCH ULTRA TEST  Use to check blood sugars 3 times per day     Insulin Pen Needle 32G X 6 MM Misc  Commonly known as:  NOVOFINE  Use daily with Victoza     Liraglutide 18 MG/3ML Sopn  Inject 1.2 mg daily     LUPRON IJ  Inject as directed every 30 (thirty) days.     leuprolide 1 MG/0.2ML injection  Commonly known as:  LUPRON  Inject as directed every 30 (thirty) days.     medroxyPROGESTERone 5 MG tablet  Commonly known  as:  PROVERA  Take 1 tablet by mouth 2 (two) times daily.     metFORMIN 500 MG tablet  Commonly known as:  GLUCOPHAGE  TAKE ONE TABLET BY MOUTH IN THE MORNING, THEN TAKE TWO TABLETS BY MOUTH AT NIGHT     multivitamin tablet  Take 1 tablet by mouth daily.     pioglitazone 30 MG tablet  Commonly known as:  ACTOS  TAKE ONE TABLET BY MOUTH ONCE DAILY     simvastatin 20 MG tablet  Commonly known as:  ZOCOR  Take 1 tablet (20 mg total) by mouth at bedtime.     VITAMIN B 12 PO  Take 1,000 Units by mouth.     Vitamin D-3 1000 UNITS Caps  Take by mouth.        Allergies:  Allergies  Allergen Reactions  . Augmentin [Amoxicillin-Pot Clavulanate] Diarrhea  . Codeine   . Lisinopril   . Sulfonamide Derivatives     Past Medical History  Diagnosis Date  . Aortic insufficiency   . Thoracic aortic aneurysm   . Hypertension   . Polymyositis   . Prostate cancer   . Diabetes mellitus   . Urinary retention     Past Surgical History  Procedure Laterality Date  . Radioactive seed implant    . US echocardiography  12/05/2010    EF 55-65%  . Cardiovascular stress test  06/12/2000    EF 59%    Family History  Problem Relation Age of Onset  . Stroke Father   . Hypertension Sister   . Diabetes Sister   . Heart attack Brother   . Hypertension Brother     Social History:  reports that he has never smoked. He does not have any smokeless tobacco history on file. He reports that he does not drink alcohol or use illicit drugs.  Review of Systems   HYPERTENSION:  recently systolic readings are ranging from 120-150 usually He has had long-standing hypertension which is usually well controlled. On his last visit he was having some lightheadedness and he thinks that blood pressure was low on those days  Anemia: Hemoglobin has been below 12 but stable, previously has been nearly normal. Iron saturation was low normal and he is taking a supplement Also taking B12 long-term  Hemoccult  was negative in 8/14   Has aortic regurgitation followed by cardiologist with CT scans every 2 years, was seen recently  He has had polymyositis followed by rheumatologist, CPK recently normal. No joint pains  HYPERLIPIDEMIA:         The lipid abnormality consists of  mildly elevated LDL controlled with simvastatin 20 mg.    History of benign prostatic hypertrophy followed by urologist; has recurrence of his prostate cancer He still has had significant problems with urinary retention, urethral stricture and is doing self-catheterization now Continues to be on Lupron injections   Examination:   BP 154/60 mmHg  Pulse 79  Temp(Src) 98.7 F (37.1 C)  Resp 16  Ht 6\' 3"  (1.905 m)  Wt 198 lb (89.812 kg)  BMI 24.75 kg/m2  SpO2 99%  Body mass index is 24.75 kg/(m^2).   No ankle edema  Standing blood pressure 138/64    Assesment:   HYPERTENSION: Blood pressure is  controlled now with current regimen He has mild increase in systolic blood pressure at times but probably having some orthostatic drop; will not adjust his medications as yet He will continue to monitor at home And report any significant change  Diabetes type 2   His blood sugars are overall  Much better with adding Amaryl in the evening He is wanting to switch back from Victoza to Januvia and this would be reasonable to try; Not clear if he has any constipation from Victoza He may tend to get some weight weight gain with this change Also considering reducing or stopping Amaryl if getting any hypoglycemia  He should be able to resume his exercise once he is subjectively feeling better  [facility] 11/01/2014, 4:46 PM

## 2014-11-01 NOTE — Patient Instructions (Addendum)
Change Victoza to Januvia 1 dailu

## 2014-11-22 ENCOUNTER — Other Ambulatory Visit: Payer: Self-pay | Admitting: Endocrinology

## 2014-11-29 ENCOUNTER — Other Ambulatory Visit: Payer: Self-pay | Admitting: Endocrinology

## 2014-11-30 ENCOUNTER — Telehealth: Payer: Self-pay | Admitting: Endocrinology

## 2014-11-30 NOTE — Telephone Encounter (Signed)
Please see below and advise.

## 2014-11-30 NOTE — Telephone Encounter (Signed)
What time of the day is the sugar the highest?

## 2014-11-30 NOTE — Telephone Encounter (Signed)
Since Nov 23 b/s 234 Lowest reading 138  Normal 90-120   Should patient be concerned of this?  Should patient go back [place]  Patient received Leuprolide injection 10/18/14 at urology  Hot flashes since injection    Please advise patient   Thank you

## 2014-12-01 ENCOUNTER — Other Ambulatory Visit: Payer: Self-pay | Admitting: *Deleted

## 2014-12-01 MED ORDER — INSULIN GLARGINE 100 UNIT/ML SOLOSTAR PEN: PEN_INJECTOR | SUBCUTANEOUS | Status: AC

## 2014-12-01 NOTE — Telephone Encounter (Signed)
Start Lantus insulin 10 units daily in the evening with a pen, may increase the dose by 2 units every 3 days if morning sugars still over 150 and follow-up in one week

## 2014-12-01 NOTE — Telephone Encounter (Signed)
Mark Daugherty said his sugars are the highest between 12-4, when he woke up this morning it was 275.  He said the lowest his sugars have been is about 197.  He's concerned because his bs is never been like this before and he thinks it has something to do with the injection he received from his urologist.  Please advise

## 2014-12-01 NOTE — Telephone Encounter (Signed)
Noted, patient is aware, rx sent 

## 2014-12-13 ENCOUNTER — Other Ambulatory Visit: Payer: Medicare PPO

## 2014-12-13 ENCOUNTER — Ambulatory Visit: Payer: Medicare PPO | Admitting: Endocrinology

## 2014-12-15 ENCOUNTER — Ambulatory Visit (INDEPENDENT_AMBULATORY_CARE_PROVIDER_SITE_OTHER): Payer: Medicare PPO | Admitting: Endocrinology

## 2014-12-15 ENCOUNTER — Encounter: Payer: Self-pay | Admitting: Endocrinology

## 2014-12-15 VITALS — BP 144/62 | HR 86 | Temp 98.0°F | Resp 16 | Ht 75.0 in | Wt 203.6 lb

## 2014-12-15 DIAGNOSIS — E1165 Type 2 diabetes mellitus with hyperglycemia: Secondary | ICD-10-CM

## 2014-12-15 DIAGNOSIS — IMO0002 Reserved for concepts with insufficient information to code with codable children: Secondary | ICD-10-CM

## 2014-12-15 DIAGNOSIS — R222 Localized swelling, mass and lump, trunk: Secondary | ICD-10-CM

## 2014-12-15 NOTE — Patient Instructions (Signed)
Victoza 5 clicks daily at same time then 0.6 mg on Monday

## 2014-12-15 NOTE — Progress Notes (Signed)
Patient ID: Mark Daugherty, male   DOB: December 26, 1941, 73 y.o.   MRN: [id number]   Reason for Appointment:  follow-up    History of Present Illness   Chief complaint:  Diabetes follow-up and lump on the chest  Type 2 DIABETES MELITUS, date of diagnosis: 1995         Prior history: He has been on various combinations of medications previously for diabetes including Amaryl  and insulin which have been discontinued Because of higher readings at times and weight gain with excessive snacking he was switched from [place] to  [place] His A1c has been generally around 7% but significantly higher in 9/15 Because of weight loss and decreased appetite he was told to leave off Victoza but this was resumed in 9/15 when he was having higher readings.  Also Amaryl 1 mg was added at suppertime  Recent history: His Victoza was changed to Januvia in 11/15 as he was complaining about constipation from Victoza and possibly the cost However his blood sugars started increasing significantly and started going over 200 He thought this was related to his Lupron injection again which may have caused hyperglycemia before this summer He was started on Lantus insulin on 12/02/14 and he is now taking 12 units; he was initially given 10 units and asked to increase this if fasting blood sugar was still over 140 Recent blood sugars:  His fasting readings have been improving and nearly normal the last 4 days Blood sugars later in the day are fluctuating with sporadic high readings at all times including after any of his meals  Average blood sugar in the last 2 weeks is 160 He also has gained back some of the Weight he had lost before He has not been able to exercise consistently especially recently because of other concurrent medical issues  Oral hypoglycemic drugs: metformin and Actos, 1 mg Amaryl at suppertime         Side effects from medications: nausea from 1.2 mg Victoza  Monitors blood glucose:  2 times a day.     Glucometer: One Touch.          Blood Glucose readings:   Recent fasting readings 88-145, nonfasting in the last week 89-204 with no consistent pattern, overall highest blood sugars on average in the last 2 weeks or midday and afternoon   Meals: 3 meals per day, breakfast is usually around 10:30 AM and usually has a meat for protein.          Physical activity: exercise: None recently        Wt Readings from Last 3 Encounters:  12/15/14 203 lb 9.6 oz (92.352 kg)  11/01/14 198 lb (89.812 kg)  09/28/14 192 lb 9.6 oz (87.363 kg)    Lab Results  Component Value Date   HGBA1C 8.4* 09/23/2014   HGBA1C 6.4 06/22/2014   HGBA1C 7.1* 03/16/2014   Lab Results  Component Value Date   MICROALBUR 1.7 09/30/2014   LDLCALC 36 03/16/2014   CREATININE 0.9 09/23/2014   CREATININE 0.92 09/23/2014     PROBLEM 2:  For the last month he has noticed a nodule on the right chest area which is not uncomfortable.  He does not know how he found this but it does not feel tender and has had no skin changes or itching around it    LABS:  No visits with results within 1 Week(s) from this visit. Latest known visit with results is:  Appointment on 09/30/2014  Component Date Value  Ref Range Status  . Microalb, Ur 09/30/2014 1.7  0.0 - 1.9 mg/dL Final  . Creatinine,U 09/30/2014 65.8   Final  . Microalb Creat Ratio 09/30/2014 2.6  0.0 - 30.0 mg/g Final      Medication List       This list is accurate as of: 12/15/14  2:28 PM.  Always use your most recent med list.               amLODipine-olmesartan 5-20 MG per tablet  Commonly known as:  AZOR  Take 1 tablet by mouth daily.     aspirin 81 MG tablet  Take 81 mg by mouth daily.     azathioprine 100 MG tablet  Commonly known as:  IMURAN  Take 100 mg by mouth daily.     bicalutamide 50 MG tablet  Commonly known as:  CASODEX  Take 1 tablet by mouth daily.     carvedilol 25 MG tablet  Commonly known as:  COREG  TAKE ONE TABLET BY MOUTH  TWICE DAILY WITH MEALS     ferrous sulfate 325 (65 FE) MG tablet  Take 325 mg by mouth daily with breakfast.     finasteride 5 MG tablet  Commonly known as:  PROSCAR  Take 5 mg by mouth daily.     glimepiride 1 MG tablet  Commonly known as:  AMARYL  TAKE ONE TABLET BY MOUTH ONCE DAILY BEFORE  SUPPER     Insulin Glargine 100 UNIT/ML Solostar Pen  Commonly known as:  LANTUS SOLOSTAR  Start Lantus insulin 10 units daily in the evening with a pen, may increase the dose by 2 units every 3 days if morning sugars still over 150     Insulin Pen Needle 32G X 6 MM Misc  Commonly known as:  NOVOFINE  Use daily with Victoza     LUPRON IJ  Inject as directed every 30 (thirty) days.     leuprolide 1 MG/0.2ML injection  Commonly known as:  LUPRON  Inject as directed every 30 (thirty) days.     medroxyPROGESTERone 5 MG tablet  Commonly known as:  PROVERA  Take 1 tablet by mouth 2 (two) times daily.     metFORMIN 500 MG tablet  Commonly known as:  GLUCOPHAGE  TAKE ONE TABLET BY MOUTH IN THE MORNING, THEN TAKE TWO TABLETS BY MOUTH AT NIGHT     multivitamin tablet  Take 1 tablet by mouth daily.     ONE TOUCH ULTRA TEST test strip  Generic drug:  glucose blood  USE TO CHECK BLOOD SUGAR THREE TIMES DAILY     pioglitazone 30 MG tablet  Commonly known as:  ACTOS  TAKE ONE TABLET BY MOUTH ONCE DAILY     simvastatin 20 MG tablet  Commonly known as:  ZOCOR  Take 1 tablet (20 mg total) by mouth at bedtime.     sitaGLIPtin 100 MG tablet  Commonly known as:  JANUVIA  Take 1 tablet (100 mg total) by mouth daily.     VITAMIN B 12 PO  Take 1,000 Units by mouth.     Vitamin D-3 1000 UNITS Caps  Take by mouth.        Allergies:  Allergies  Allergen Reactions  . Augmentin [Amoxicillin-Pot Clavulanate] Diarrhea  . Codeine   . Lisinopril   . Sulfonamide Derivatives     Past Medical History  Diagnosis Date  . Aortic insufficiency   . Thoracic aortic aneurysm   . Hypertension    .  Polymyositis   . Prostate cancer   . Diabetes mellitus   . Urinary retention     Past Surgical History  Procedure Laterality Date  . Radioactive seed implant    . US echocardiography  12/05/2010    EF 55-65%  . Cardiovascular stress test  06/12/2000    EF 59%    Family History  Problem Relation Age of Onset  . Stroke Father   . Hypertension Sister   . Diabetes Sister   . Heart attack Brother   . Hypertension Brother     Social History:  reports that he has never smoked. He does not have any smokeless tobacco history on file. He reports that he does not drink alcohol or use illicit drugs.  Review of Systems   Updated review of systems from the visit of 11/01/14  HYPERTENSION:  recently systolic readings are ranging from 120-150 usually He has had long-standing hypertension which is usually well controlled. On his last visit he was having some lightheadedness and he thinks that blood pressure was low on those days  Anemia: Hemoglobin has been below 12 but stable, previously has been nearly normal. Iron saturation was low normal and he is taking a supplement Also taking B12 long-term   Hemoccult was negative in 8/14   Has aortic regurgitation followed by cardiologist with CT scans every 2 years, was seen recently  He has had polymyositis followed by rheumatologist, CPK recently normal. No joint pains  HYPERLIPIDEMIA:         The lipid abnormality consists of  mildly elevated LDL controlled with simvastatin 20 mg.    History of benign prostatic hypertrophy followed by urologist; has recurrence of his prostate cancer with positive biopsy His PSA in 5/15 was low He still has had significant problems with urinary retention, urethral stricture and is doing self-catheterization now Recently had another Lupron  injection   Examination:   BP 144/62 mmHg  Pulse 86  Temp(Src) 98 F (36.7 C)  Resp 16  Ht 6\' 3"  (1.905 m)  Wt 203 lb 9.6 oz (92.352 kg)  BMI 25.45 kg/m2   SpO2 97%  Body mass index is 25.45 kg/(m^2).   No ankle edema  He has a 1 cm slightly irregular firm mobile nodule on the right outer chest away from the axilla No axillary lymph nodes on each side No breast mass felt on the right side  No lymphadenopathy in the neck, no thyroid enlargement  Assesment:   Right chest wall nodule: Unclear of the etiology. Will need to get a biopsy done and will refer to [facility]  Diabetes type 2   His blood sugars are improving with adding basal insulin but he has sporadic high post prandial readings as discussed in detail in history of present illness Again not clear if he has had hyperglycemia after getting his Lupron injection and his urologist did not feel this is the reason; most likely it is related to stopping Victoza  He is also on Januvia and Amaryl and not clear if he is benefiting from [place] He did do very well with Victoza although had some difficulties with possible constipation  Since he has post prandial hyperglycemia he should try his Victoza again at very low doses and titrate gradually Discussed trying to use 0.3 mg initially and then trying 0.6 until his next visit May be able to stop his insulin and fasting blood sugars also improved He may continue to gain weight with insulin also He will try to  have balanced meals with consistent amount of carbohydrate and protein   He should be able to resume his exercise once he is subjectively feeling better  Hypertension: Well controlled  Patient Instructions  Victoza 5 clicks daily at same time then 0.6 mg on Monday   Counseling time over 50% of today's 25 minute visit  Mark Daugherty 12/15/2014, 2:28 PM

## 2014-12-21 ENCOUNTER — Telehealth: Payer: Self-pay | Admitting: Endocrinology

## 2014-12-21 NOTE — Telephone Encounter (Signed)
Pt would like last 2 lab reports sent to Dr. Arlean Hopping at [facility] orth[facility] P# [phone]

## 2014-12-21 NOTE — Telephone Encounter (Signed)
Labs faxed

## 2014-12-22 ENCOUNTER — Other Ambulatory Visit: Payer: Medicare PPO

## 2014-12-26 ENCOUNTER — Other Ambulatory Visit: Payer: Self-pay | Admitting: Endocrinology

## 2014-12-27 ENCOUNTER — Other Ambulatory Visit: Payer: Self-pay | Admitting: *Deleted

## 2014-12-27 MED ORDER — INSULIN PEN NEEDLE 32G X 6 MM MISC: Status: AC

## 2014-12-28 ENCOUNTER — Ambulatory Visit: Payer: Medicare PPO | Admitting: Endocrinology

## 2014-12-29 ENCOUNTER — Telehealth: Payer: Self-pay | Admitting: Endocrinology

## 2014-12-29 NOTE — Telephone Encounter (Signed)
[facility] # [phone]  Pt's primary care states they faxed lab orders for the pt to have them done here back on 12/15/14. There were no labs done on his appt Regan is now asking whether we did or did not receive the orders? Please call her with the answer.

## 2015-01-07 ENCOUNTER — Other Ambulatory Visit: Payer: Self-pay | Admitting: Surgery

## 2015-01-07 DIAGNOSIS — N63 Unspecified lump in unspecified breast: Secondary | ICD-10-CM

## 2015-01-10 ENCOUNTER — Encounter: Payer: Self-pay | Admitting: Pulmonary Disease

## 2015-01-10 ENCOUNTER — Ambulatory Visit (INDEPENDENT_AMBULATORY_CARE_PROVIDER_SITE_OTHER)
Admission: RE | Admit: 2015-01-10 | Discharge: 2015-01-10 | Disposition: A | Discharge status: Home / Self Care | Payer: Medicare PPO | Source: Ambulatory Visit | Attending: Pulmonary Disease | Admitting: Pulmonary Disease

## 2015-01-10 ENCOUNTER — Ambulatory Visit (INDEPENDENT_AMBULATORY_CARE_PROVIDER_SITE_OTHER): Payer: Medicare PPO | Admitting: Pulmonary Disease

## 2015-01-10 VITALS — BP 124/72 | HR 78 | Temp 97.6°F | Ht 75.5 in | Wt 208.6 lb

## 2015-01-10 DIAGNOSIS — J849 Interstitial pulmonary disease, unspecified: Secondary | ICD-10-CM

## 2015-01-10 NOTE — Progress Notes (Signed)
   Subjective:    Patient ID: Mark Daugherty, male    DOB: [birth year], 74 y.o.   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 He continues to be treated with Imuran by his rheumatologist, and has been very stable from a pulmonary standpoint. He feels that his breathing is at baseline, and has had no significant cough. He is due for his follow-up chest x-ray today.   Review of Systems  Constitutional: Negative for fever and unexpected weight change.  HENT: Positive for congestion. Negative for dental problem, ear pain, nosebleeds, postnasal drip, rhinorrhea, sinus pressure, sneezing, sore throat and trouble swallowing.   Eyes: Negative for redness and itching.  Respiratory: Negative for cough, chest tightness, shortness of breath and wheezing.   Cardiovascular: Negative for palpitations and leg swelling.  Gastrointestinal: Negative for nausea and vomiting.  Genitourinary: Negative for dysuria.  Musculoskeletal: Negative for joint swelling.  Skin: Negative for rash.  Neurological: Negative for headaches.  Hematological: Does not bruise/bleed easily.  Psychiatric/Behavioral: Negative for dysphoric mood. The patient is not nervous/anxious.        Objective:   Physical Exam Thin male in no acute distress Nose without purulence or discharge noted Neck without lymphadenopathy or thyromegaly Chest with crackles in the lower one third, no wheezing Cardiac exam with regular rate and rhythm, 2/6 systolic murmur Lower extremities with no significant edema, no cyanosis Alert and oriented, moves all 4 extremities.       Assessment & Plan:

## 2015-01-10 NOTE — Assessment & Plan Note (Signed)
The patient continues to do very well overall from an interstitial lung disease standpoint. He is being treated for polymyositis by his rheumatologist with Imuran, and we have not seen pulmonary disease progression. I will check a chest x-ray today for completeness, but the patient has not wanted to continue doing yearly pulmonary function studies. He feels that he is completely stable from a breathing standpoint. I have asked him to stay as active as possible, and would like to see him back in about 6 months.

## 2015-01-10 NOTE — Patient Instructions (Signed)
Your exam today is unchanged, and will check chest xray and call you with results Try and stay as active as possible Continue close followup with rheumatology, for treatment of your polymyositis followup with me again in 34mos.

## 2015-01-13 ENCOUNTER — Other Ambulatory Visit: Payer: Self-pay | Admitting: Surgery

## 2015-01-13 ENCOUNTER — Ambulatory Visit
Admission: RE | Admit: 2015-01-13 | Discharge: 2015-01-13 | Disposition: A | Discharge status: Home / Self Care | Payer: Medicare PPO | Source: Ambulatory Visit | Attending: Surgery | Admitting: Surgery

## 2015-01-13 DIAGNOSIS — N63 Unspecified lump in unspecified breast: Secondary | ICD-10-CM

## 2015-01-14 ENCOUNTER — Other Ambulatory Visit: Payer: Self-pay | Admitting: *Deleted

## 2015-01-14 ENCOUNTER — Other Ambulatory Visit (INDEPENDENT_AMBULATORY_CARE_PROVIDER_SITE_OTHER): Payer: Medicare PPO

## 2015-01-14 DIAGNOSIS — IMO0002 Reserved for concepts with insufficient information to code with codable children: Secondary | ICD-10-CM

## 2015-01-14 DIAGNOSIS — E1165 Type 2 diabetes mellitus with hyperglycemia: Secondary | ICD-10-CM

## 2015-01-14 LAB — CBC WITH DIFFERENTIAL/PLATELET
BASOS ABS: 0 10*3/uL (ref 0.0–0.1)
BASOS PCT: 0.4 % (ref 0.0–3.0)
Eosinophils Absolute: 0.2 10*3/uL (ref 0.0–0.7)
Eosinophils Relative: 3.3 % (ref 0.0–5.0)
HCT: 31 % — ABNORMAL LOW (ref 39.0–52.0)
Hemoglobin: 10.1 g/dL — ABNORMAL LOW (ref 13.0–17.0)
LYMPHS ABS: 1.3 10*3/uL (ref 0.7–4.0)
Lymphocytes Relative: 22.9 % (ref 12.0–46.0)
MCHC: 32.7 g/dL (ref 30.0–36.0)
MCV: 92.4 fl (ref 78.0–100.0)
Monocytes Absolute: 0.8 10*3/uL (ref 0.1–1.0)
Monocytes Relative: 14.6 % — ABNORMAL HIGH (ref 3.0–12.0)
NEUTROS ABS: 3.3 10*3/uL (ref 1.4–7.7)
Neutrophils Relative %: 58.8 % (ref 43.0–77.0)
Platelets: 158 10*3/uL (ref 150.0–400.0)
RBC: 3.35 Mil/uL — AB (ref 4.22–5.81)
RDW: 13.7 % (ref 11.5–15.5)
WBC: 5.6 10*3/uL (ref 4.0–10.5)

## 2015-01-14 LAB — COMPREHENSIVE METABOLIC PANEL
ALBUMIN: 3.5 g/dL (ref 3.5–5.2)
ALT: 16 U/L (ref 0–53)
AST: 23 U/L (ref 0–37)
Alkaline Phosphatase: 58 U/L (ref 39–117)
BUN: 15 mg/dL (ref 6–23)
CO2: 30 meq/L (ref 19–32)
Calcium: 9.2 mg/dL (ref 8.4–10.5)
Chloride: 104 mEq/L (ref 96–112)
Creatinine, Ser: 0.92 mg/dL (ref 0.40–1.50)
GFR: 103 mL/min (ref >60.00)
Glucose, Bld: 199 mg/dL — ABNORMAL HIGH (ref 70–99)
Potassium: 4.2 mEq/L (ref 3.5–5.1)
Sodium: 137 mEq/L (ref 135–145)
Total Bilirubin: 0.3 mg/dL (ref 0.2–1.2)
Total Protein: 7.2 g/dL (ref 6.0–8.3)

## 2015-01-14 LAB — HEMOGLOBIN A1C: Hgb A1c MFr Bld: 8.7 % — ABNORMAL HIGH (ref 4.6–6.5)

## 2015-01-14 LAB — CK: Total CK: 150 U/L (ref 7–232)

## 2015-01-18 ENCOUNTER — Other Ambulatory Visit: Payer: Self-pay | Admitting: Surgery

## 2015-01-18 DIAGNOSIS — N63 Unspecified lump in unspecified breast: Secondary | ICD-10-CM

## 2015-01-19 ENCOUNTER — Encounter: Payer: Self-pay | Admitting: Endocrinology

## 2015-01-19 ENCOUNTER — Telehealth: Payer: Self-pay | Admitting: Endocrinology

## 2015-01-19 ENCOUNTER — Ambulatory Visit (INDEPENDENT_AMBULATORY_CARE_PROVIDER_SITE_OTHER): Payer: Medicare PPO | Admitting: Endocrinology

## 2015-01-19 VITALS — BP 149/68 | Temp 98.8°F | Wt 208.0 lb

## 2015-01-19 DIAGNOSIS — R5383 Other fatigue: Secondary | ICD-10-CM

## 2015-01-19 DIAGNOSIS — E1165 Type 2 diabetes mellitus with hyperglycemia: Secondary | ICD-10-CM

## 2015-01-19 DIAGNOSIS — I8001 Phlebitis and thrombophlebitis of superficial vessels of right lower extremity: Secondary | ICD-10-CM

## 2015-01-19 DIAGNOSIS — IMO0002 Reserved for concepts with insufficient information to code with codable children: Secondary | ICD-10-CM

## 2015-01-19 DIAGNOSIS — D649 Anemia, unspecified: Secondary | ICD-10-CM | POA: Diagnosis not present

## 2015-01-19 DIAGNOSIS — E785 Hyperlipidemia, unspecified: Secondary | ICD-10-CM

## 2015-01-19 DIAGNOSIS — D638 Anemia in other chronic diseases classified elsewhere: Secondary | ICD-10-CM

## 2015-01-19 DIAGNOSIS — R42 Dizziness and giddiness: Secondary | ICD-10-CM

## 2015-01-19 LAB — COMPREHENSIVE METABOLIC PANEL
ALK PHOS: 64 U/L (ref 39–117)
ALT: 19 U/L (ref 0–53)
AST: 25 U/L (ref 0–37)
Albumin: 3.8 g/dL (ref 3.5–5.2)
BILIRUBIN TOTAL: 0.3 mg/dL (ref 0.2–1.2)
BUN: 14 mg/dL (ref 6–23)
CO2: 29 mEq/L (ref 19–32)
Calcium: 9.4 mg/dL (ref 8.4–10.5)
Chloride: 102 mEq/L (ref 96–112)
Creatinine, Ser: 0.96 mg/dL (ref 0.40–1.50)
GFR: 98.06 mL/min (ref >60.00)
Glucose, Bld: 179 mg/dL — ABNORMAL HIGH (ref 70–99)
Potassium: 4.1 mEq/L (ref 3.5–5.1)
Sodium: 136 mEq/L (ref 135–145)
TOTAL PROTEIN: 7.4 g/dL (ref 6.0–8.3)

## 2015-01-19 LAB — IBC PANEL
IRON: 47 ug/dL (ref 42–165)
Saturation Ratios: 14.5 % — ABNORMAL LOW (ref 20.0–50.0)
TRANSFERRIN: 231 mg/dL (ref 212.0–360.0)

## 2015-01-19 MED ORDER — DICLOFENAC SODIUM 3 % TD GEL: TRANSDERMAL | Status: DC

## 2015-01-19 NOTE — Addendum Note (Signed)
Addended by: Netta Neat D on: 01/19/2015 11:13 AM   Modules accepted: Orders

## 2015-01-19 NOTE — Progress Notes (Signed)
Quick Note:  Please let him know that I level is low, start taking iron tablets twice a day with food ______

## 2015-01-19 NOTE — Patient Instructions (Addendum)
9 Lantus daily and keep am sugar <120  Stop Januvia  [place] upto 3x daily

## 2015-01-19 NOTE — Telephone Encounter (Signed)
[facility] calling to see if they can order a creatinine to go along with the estimate GsR order so the testing can be completed.  # [phone]

## 2015-01-19 NOTE — Telephone Encounter (Signed)
Please read note below and advise.  

## 2015-01-19 NOTE — Progress Notes (Signed)
Pre visit review using our clinic review tool, if applicable. No additional management support is needed unless otherwise documented below in the visit note. 

## 2015-01-19 NOTE — Progress Notes (Signed)
Patient ID: Mark Daugherty, male   DOB: [birth year], 74 y.o.   MRN: [id number]   Reason for Appointment:  follow-up  of various problems  History of Present Illness   Chief complaint: Vertigo.  In December he had an episode of sudden vertigo while he was driving Subsequently he has had relatively milder symptoms mostly when he is trying to move and get up from sitting He has taken OTC meclizine but is taking only 2 tablets in the morning daily He says he has had similar symptoms several years ago but was not treated in any specific way  Problem 2:   Diabetes follow-up   Type 2 DIABETES MELITUS, date of diagnosis: 1995         Prior history: He has been on various combinations of medications previously for diabetes including Amaryl  and insulin which have been discontinued Because of higher readings at times and weight gain with excessive snacking he was switched from [place] to  [place] His A1c has been generally around 7% but significantly higher in 9/15 Because of weight loss and decreased appetite he was told to leave off Victoza   Recent history: His Victoza restarted in 12/15 because of higher blood sugars, some over 200 He does not complain of any decreased appetite, constipation or nausea and is dialing up 7.9+0 clicks He was started on Lantus insulin on 12/02/14 and he is now taking 8 units; he was able to reduce the dose when starting Victoza Recent blood sugars from one touch download:   PRE-MEAL Breakfast Lunch Dinner Bedtime Overall  Glucose range:  87-174   72-278   69-233   126-232    Median:  113   154   133   176   131    His fasting readings have been somewhat variable but trending little higher the last 3 days Has sporadic high readings after meals or at lunchtime but not consistently recently He has not had any weight change He however did not stop his Januvia which she was told to  Oral hypoglycemic drugs: metformin and Actos, 1 mg Amaryl at suppertime          Side effects from medications: nausea from 1.2 mg Victoza  Meals: 3 meals per day, breakfast is usually around 10:30 AM and usually has a meat for protein.          Physical activity: exercise: He is trying a little but is not able to do much because of fatigue        Wt Readings from Last 3 Encounters:  01/19/15 208 lb (94.348 kg)  01/10/15 208 lb 9.6 oz (94.62 kg)  12/15/14 203 lb 9.6 oz (92.352 kg)    Lab Results  Component Value Date   HGBA1C 8.7* 01/14/2015   HGBA1C 8.4* 09/23/2014   HGBA1C 6.4 06/22/2014   Lab Results  Component Value Date   MICROALBUR 1.7 09/30/2014   LDLCALC 36 03/16/2014   CREATININE 0.92 01/14/2015     PROBLEM 2:  For the last month he has noticed a nodule on the right chest area which is not uncomfortable.  He does not know how he found this but it does not feel tender and has had no skin changes or itching around it    LABS:  Appointment on 01/14/2015  Component Date Value Ref Range Status  . Hgb A1c MFr Bld 01/14/2015 8.7* 4.6 - 6.5 % Final   Glycemic Control Guidelines for People with Diabetes:Non Diabetic:  <6%Goal of  Therapy: <7%Additional Action Suggested:  >8%   . WBC 01/14/2015 5.6  4.0 - 10.5 K/uL Final  . RBC 01/14/2015 3.35* 4.22 - 5.81 Mil/uL Final  . Hemoglobin 01/14/2015 10.1* 13.0 - 17.0 g/dL Final  . HCT 01/14/2015 31.0* 39.0 - 52.0 % Final  . MCV 01/14/2015 92.4  78.0 - 100.0 fl Final  . MCHC 01/14/2015 32.7  30.0 - 36.0 g/dL Final  . RDW 01/14/2015 13.7  11.5 - 15.5 % Final  . Platelets 01/14/2015 158.0  150.0 - 400.0 K/uL Final  . Neutrophils Relative % 01/14/2015 58.8  43.0 - 77.0 % Final  . Lymphocytes Relative 01/14/2015 22.9  12.0 - 46.0 % Final  . Monocytes Relative 01/14/2015 14.6* 3.0 - 12.0 % Final  . Eosinophils Relative 01/14/2015 3.3  0.0 - 5.0 % Final  . Basophils Relative 01/14/2015 0.4  0.0 - 3.0 % Final  . Neutro Abs 01/14/2015 3.3  1.4 - 7.7 K/uL Final  . Lymphs Abs 01/14/2015 1.3  0.7 - 4.0 K/uL Final   . Monocytes Absolute 01/14/2015 0.8  0.1 - 1.0 K/uL Final  . Eosinophils Absolute 01/14/2015 0.2  0.0 - 0.7 K/uL Final  . Basophils Absolute 01/14/2015 0.0  0.0 - 0.1 K/uL Final  . Sodium 01/14/2015 137  135 - 145 mEq/L Final  . Potassium 01/14/2015 4.2  3.5 - 5.1 mEq/L Final  . Chloride 01/14/2015 104  96 - 112 mEq/L Final  . CO2 01/14/2015 30  19 - 32 mEq/L Final  . Glucose, Bld 01/14/2015 199* 70 - 99 mg/dL Final  . BUN 01/14/2015 15  6 - 23 mg/dL Final  . Creatinine, Ser 01/14/2015 0.92  0.40 - 1.50 mg/dL Final  . Total Bilirubin 01/14/2015 0.3  0.2 - 1.2 mg/dL Final  . Alkaline Phosphatase 01/14/2015 58  39 - 117 U/L Final  . AST 01/14/2015 23  0 - 37 U/L Final  . ALT 01/14/2015 16  0 - 53 U/L Final  . Total Protein 01/14/2015 7.2  6.0 - 8.3 g/dL Final  . Albumin 01/14/2015 3.5  3.5 - 5.2 g/dL Final  . Calcium 01/14/2015 9.2  8.4 - 10.5 mg/dL Final  . GFR 01/14/2015 103.00  >60.00 mL/min Final  . Total CK 01/14/2015 150  7 - 232 U/L Final      Medication List       This list is accurate as of: 01/19/15  8:06 AM.  Always use your most recent med list.               amLODipine-olmesartan 5-20 MG per tablet  Commonly known as:  AZOR  Take 1 tablet by mouth daily.     aspirin 81 MG tablet  Take 81 mg by mouth daily.     azaTHIOprine 50 MG tablet  Commonly known as:  IMURAN  Take 50 mg by mouth daily.     carvedilol 25 MG tablet  Commonly known as:  COREG  TAKE ONE TABLET BY MOUTH TWICE DAILY WITH MEALS     ferrous sulfate 325 (65 FE) MG tablet  Take 325 mg by mouth daily with breakfast.     finasteride 5 MG tablet  Commonly known as:  PROSCAR  Take 5 mg by mouth daily.     glimepiride 1 MG tablet  Commonly known as:  AMARYL  TAKE ONE TABLET BY MOUTH ONCE DAILY BEFORE  SUPPER     Insulin Glargine 100 UNIT/ML Solostar Pen  Commonly known as:  [employer]  Lantus insulin 10 units daily in the evening with a pen, may increase the dose by 2 units  every 3 days if morning sugars still over 150     Insulin Pen Needle 32G X 6 MM Misc  Commonly known as:  NOVOFINE  Use daily with Victoza     leuprolide 1 MG/0.2ML injection  Commonly known as:  LUPRON  Inject as directed every 4 months     metFORMIN 500 MG tablet  Commonly known as:  GLUCOPHAGE  TAKE ONE TABLET BY MOUTH IN THE MORNING, THEN TAKE TWO TABLETS BY MOUTH AT NIGHT     multivitamin tablet  Take 1 tablet by mouth daily.     ONE TOUCH ULTRA TEST test strip  Generic drug:  glucose blood  USE TO CHECK BLOOD SUGAR THREE TIMES DAILY     pioglitazone 30 MG tablet  Commonly known as:  ACTOS  TAKE ONE TABLET BY MOUTH ONCE DAILY     simvastatin 20 MG tablet  Commonly known as:  ZOCOR  Take 1 tablet (20 mg total) by mouth at bedtime.     sitaGLIPtin 100 MG tablet  Commonly known as:  JANUVIA  Take 1 tablet (100 mg total) by mouth daily.     VITAMIN B 12 PO  Take 1,000 Units by mouth.     Vitamin D-3 1000 UNITS Caps  Take by mouth.        Allergies:  Allergies  Allergen Reactions  . Augmentin [Amoxicillin-Pot Clavulanate] Diarrhea  . Codeine   . Lisinopril   . Sulfonamide Derivatives     Past Medical History  Diagnosis Date  . Aortic insufficiency   . Thoracic aortic aneurysm   . Hypertension   . Polymyositis   . Prostate cancer   . Diabetes mellitus   . Urinary retention     Past Surgical History  Procedure Laterality Date  . Radioactive seed implant    . US echocardiography  12/05/2010    EF 55-65%  . Cardiovascular stress test  06/12/2000    EF 59%    Family History  Problem Relation Age of Onset  . Stroke Father   . Hypertension Sister   . Diabetes Sister   . Heart attack Brother   . Hypertension Brother     Social History:  reports that he has never smoked. He does not have any smokeless tobacco history on file. He reports that he does not drink alcohol or use illicit drugs.  Review of Systems   He is complaining about painful  swelling on the right upper outer leg which started a day ago.  Has some soreness but not much pain.  The rest of the leg is not swelling.  HYPERTENSION:  recently systolic readings are ranging from 120-130 usually He has had long-standing hypertension which is usually well controlled.  Blood pressure appears to be in the 140 range in the office but much better with the pulmonologist recently also  Anemia: Hemoglobin has been below 12 but stable, however no hemoglobin is down to 10.1 for no obvious reason. He is taking vitamin B-12 supplement and his level was above normal in 9/15 . Iron saturation was low normal in early 2015; he is taking not a supplement    Hemoccult was negative in 8/14   Lab Results  Component Value Date   WBC 5.6 01/14/2015   HGB 10.1* 01/14/2015   HCT 31.0* 01/14/2015   MCV 92.4 01/14/2015   PLT 158.0 01/14/2015  Has aortic regurgitation followed by cardiologist with CT scans every 2 years, was seen recently  He has had polymyositis followed by rheumatologist, CPK recently normal. No joint pains  HYPERLIPIDEMIA:         The lipid abnormality consists of  mildly elevated LDL controlled with simvastatin 20 mg.    History of benign prostatic hypertrophy followed by urologist; has recurrence of his prostate cancer with positive biopsy His PSA in 5/15 was low He still has had significant problems with urinary retention, urethral stricture and is doing self-catheterization  Recently had another Lupron  injection and he feels somewhat fatigued as well as having hot flashes and reduced libido  Right chest wall mass: He is going to get a biopsy.  Apparently has abnormal mammogram also    Examination:   BP 149/68 mmHg  Temp(Src) 98.8 F (37.1 C) (Oral)  Wt 208 lb (94.348 kg)  Body mass index is 25.65 kg/(m^2).   No ankle edema  He has a slightly firm mildly swollen area on the right upper outer leg and the lower part has the outline of a distended vein.  No  edema on the rest of the leg and no calf tenderness  Assesment:   1.  Probable benign positional vertigo.  Most likely this could be related to labyrinthitis and symptoms are relatively improved errantly Suggested referral to physical therapy but he does not want to do that He can try OTC meclizine 3 times a day until next week  2.  Probable superficial thrombophlebitis of the right leg.  He will try OTC Voltaren gel and if not better by next week may try meloxicam, or Celebrex.  Would like to avoid this now especially with his multiple medical problems and anemia  3.  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  Also need to recheck his iron level and Hemoccult.  He does take Imuran but this has not caused anemia previously.  Will send labs to rheumatologist and consider consultation with hematologist  4.  Diabetes type 2   His blood sugars are improving with adding basal insulin  Again has sporadic high post prandial readings as discussed in detail in history of present illness Has done well with low-dose Lantus and fasting blood sugars are relatively good  He is also on Januvia and Amaryl and we will stop Januvia He is doing fairly  well with Victoza and is taking about 0.9 mg without side effects at the moment He does have a few high postprandial readings and his A1c is significantly high but considering his multiple problems recent blood sugars are adequately controlled  He should be able to resume his exercise once he is subjectively feeling better  Hypertension: Well controlled although relatively higher in the office  Total visit time including evaluation of multiple problems, Labs = 25 minutes   Frayda Egley 01/19/2015, 8:06 AM

## 2015-01-19 NOTE — Telephone Encounter (Signed)
We have not sent any labs to [facility], I don't understand

## 2015-01-20 LAB — PATHOLOGIST SMEAR REVIEW

## 2015-01-21 ENCOUNTER — Other Ambulatory Visit: Payer: Medicare PPO

## 2015-01-25 ENCOUNTER — Telehealth: Payer: Self-pay | Admitting: Endocrinology

## 2015-01-25 NOTE — Telephone Encounter (Signed)
Let [facility] know that Dr. Dwyane Dee did not send any labs to [facility]

## 2015-01-27 ENCOUNTER — Other Ambulatory Visit: Payer: Medicare PPO

## 2015-01-28 ENCOUNTER — Telehealth: Payer: Self-pay | Admitting: Endocrinology

## 2015-01-28 ENCOUNTER — Other Ambulatory Visit: Payer: Self-pay | Admitting: *Deleted

## 2015-01-28 MED ORDER — MECLIZINE HCL 25 MG PO TABS: 25.0000 mg | ORAL_TABLET | Freq: Three times a day (TID) | ORAL | Status: DC | PRN

## 2015-01-28 MED ORDER — MECLIZINE HCL 25 MG PO TABS: 25.0000 mg | ORAL_TABLET | Freq: Three times a day (TID) | ORAL | Status: AC | PRN

## 2015-01-28 NOTE — Telephone Encounter (Signed)
105/59 pulse 97

## 2015-01-28 NOTE — Telephone Encounter (Signed)
Blood pressure is too low He can leave off his AZOR for 2 days and then start with half a tablet a day. He can continue taking meclizine 3 times a day as needed, may need neurology consultation if not better

## 2015-01-28 NOTE — Telephone Encounter (Signed)
Noted, patient is aware. 

## 2015-01-28 NOTE — Telephone Encounter (Signed)
Please see below for patients standing bp

## 2015-01-28 NOTE — Telephone Encounter (Signed)
He needs to check his blood pressure standing up and let me know

## 2015-01-28 NOTE — Telephone Encounter (Signed)
Please see below.

## 2015-01-28 NOTE — Telephone Encounter (Signed)
Patient states that what he believes is his vertigo symptoms are getting worse  Feeling faint, dizziness, and passing out   Please advise patient   Thank you    P.S. He will be back home around 11:00 am went to get haircut :)

## 2015-01-30 ENCOUNTER — Other Ambulatory Visit: Payer: Self-pay | Admitting: Endocrinology

## 2015-02-01 ENCOUNTER — Ambulatory Visit: Payer: Medicare PPO | Admitting: Endocrinology

## 2015-02-03 ENCOUNTER — Ambulatory Visit
Admission: RE | Admit: 2015-02-03 | Discharge: 2015-02-03 | Disposition: A | Discharge status: Home / Self Care | Payer: Medicare PPO | Source: Ambulatory Visit | Attending: Surgery | Admitting: Surgery

## 2015-02-03 ENCOUNTER — Other Ambulatory Visit: Payer: Self-pay | Admitting: Endocrinology

## 2015-02-03 DIAGNOSIS — N63 Unspecified lump in unspecified breast: Secondary | ICD-10-CM

## 2015-02-08 ENCOUNTER — Other Ambulatory Visit (INDEPENDENT_AMBULATORY_CARE_PROVIDER_SITE_OTHER): Payer: Self-pay | Admitting: Surgery

## 2015-02-08 ENCOUNTER — Other Ambulatory Visit: Payer: Medicare PPO

## 2015-02-08 ENCOUNTER — Other Ambulatory Visit (INDEPENDENT_AMBULATORY_CARE_PROVIDER_SITE_OTHER): Payer: Self-pay | Admitting: *Deleted

## 2015-02-08 DIAGNOSIS — C799 Secondary malignant neoplasm of unspecified site: Secondary | ICD-10-CM

## 2015-02-08 NOTE — Telephone Encounter (Signed)
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 They will keep you posted.

## 2015-02-09 ENCOUNTER — Telehealth: Payer: Self-pay | Admitting: Internal Medicine

## 2015-02-09 ENCOUNTER — Emergency Department (HOSPITAL_COMMUNITY)
Admission: EM | Admit: 2015-02-09 | Discharge: 2015-02-09 | Disposition: A | Discharge status: Home / Self Care | Payer: Medicare PPO | Attending: Emergency Medicine | Admitting: Emergency Medicine

## 2015-02-09 ENCOUNTER — Ambulatory Visit: Admission: RE | Admit: 2015-02-09 | Payer: Medicare PPO | Source: Ambulatory Visit

## 2015-02-09 ENCOUNTER — Ambulatory Visit
Admission: RE | Admit: 2015-02-09 | Discharge: 2015-02-09 | Disposition: A | Discharge status: Home / Self Care | Payer: Medicare PPO | Source: Ambulatory Visit | Attending: Surgery | Admitting: Surgery

## 2015-02-09 ENCOUNTER — Encounter (HOSPITAL_COMMUNITY): Payer: Self-pay | Admitting: Emergency Medicine

## 2015-02-09 ENCOUNTER — Emergency Department (HOSPITAL_COMMUNITY): Payer: Medicare PPO

## 2015-02-09 DIAGNOSIS — Z794 Long term (current) use of insulin: Secondary | ICD-10-CM | POA: Insufficient documentation

## 2015-02-09 DIAGNOSIS — R935 Abnormal findings on diagnostic imaging of other abdominal regions, including retroperitoneum: Secondary | ICD-10-CM

## 2015-02-09 DIAGNOSIS — Z7982 Long term (current) use of aspirin: Secondary | ICD-10-CM | POA: Insufficient documentation

## 2015-02-09 DIAGNOSIS — I10 Essential (primary) hypertension: Secondary | ICD-10-CM | POA: Insufficient documentation

## 2015-02-09 DIAGNOSIS — R938 Abnormal findings on diagnostic imaging of other specified body structures: Secondary | ICD-10-CM | POA: Insufficient documentation

## 2015-02-09 DIAGNOSIS — Z8546 Personal history of malignant neoplasm of prostate: Secondary | ICD-10-CM | POA: Insufficient documentation

## 2015-02-09 DIAGNOSIS — E119 Type 2 diabetes mellitus without complications: Secondary | ICD-10-CM | POA: Diagnosis not present

## 2015-02-09 DIAGNOSIS — Z79899 Other long term (current) drug therapy: Secondary | ICD-10-CM | POA: Insufficient documentation

## 2015-02-09 DIAGNOSIS — R9389 Abnormal findings on diagnostic imaging of other specified body structures: Secondary | ICD-10-CM

## 2015-02-09 DIAGNOSIS — I2699 Other pulmonary embolism without acute cor pulmonale: Secondary | ICD-10-CM | POA: Insufficient documentation

## 2015-02-09 LAB — I-STAT TROPONIN, ED: TROPONIN I, POC: 0 ng/mL (ref 0.00–0.08)

## 2015-02-09 LAB — CBC
HEMATOCRIT: 29.8 % — AB (ref 39.0–52.0)
Hemoglobin: 9.9 g/dL — ABNORMAL LOW (ref 13.0–17.0)
MCH: 30.2 pg (ref 26.0–34.0)
MCHC: 33.2 g/dL (ref 30.0–36.0)
MCV: 90.9 fL (ref 78.0–100.0)
Platelets: 192 10*3/uL (ref 150–400)
RBC: 3.28 MIL/uL — ABNORMAL LOW (ref 4.22–5.81)
RDW: 14.3 % (ref 11.5–15.5)
WBC: 7.6 10*3/uL (ref 4.0–10.5)

## 2015-02-09 LAB — BASIC METABOLIC PANEL
ANION GAP: 8 (ref 5–15)
BUN: 15 mg/dL (ref 6–23)
CALCIUM: 9 mg/dL (ref 8.4–10.5)
CO2: 25 mmol/L (ref 19–32)
Chloride: 100 mmol/L (ref 96–112)
Creatinine, Ser: 0.99 mg/dL (ref 0.50–1.35)
GFR calc non Af Amer: 78 mL/min — ABNORMAL LOW (ref >90)
GLUCOSE: 296 mg/dL — AB (ref 70–99)
POTASSIUM: 3.9 mmol/L (ref 3.5–5.1)
SODIUM: 133 mmol/L — AB (ref 135–145)

## 2015-02-09 MED ORDER — RIVAROXABAN 15 MG PO TABS
15.0000 mg | ORAL_TABLET | Freq: Once | ORAL | Status: AC
  Administered 2015-02-09: 15 mg via ORAL
  Filled 2015-02-09: qty 1

## 2015-02-09 MED ORDER — IOHEXOL 300 MG/ML  SOLN
125.0000 mL | Freq: Once | INTRAMUSCULAR | Status: AC | PRN
  Administered 2015-02-09: 125 mL via INTRAVENOUS

## 2015-02-09 MED ORDER — XARELTO VTE STARTER PACK 15 & 20 MG PO TBPK: 15.0000 mg | ORAL_TABLET | ORAL | Status: AC

## 2015-02-09 NOTE — ED Notes (Addendum)
Gertie Fey, PA, now at the bedside.

## 2015-02-09 NOTE — Discharge Instructions (Signed)
You have evidence of blood clot in your lung.  Please take Xarelto as prescribed and follow up closely with your doctor tomorrow for further care.  Return to ER if your condition worsen or if you have other concerns.    Pulmonary Embolism A pulmonary (lung) embolism (PE) is a blood clot that has traveled to the lung and results in a blockage of blood flow in the affected lung. Most clots come from deep veins in the legs or pelvis. PE is a dangerous and potentially life-threatening condition that can be treated if identified. CAUSES Blood clots form in a vein for different reasons. Usually several things cause blood clots. They include:  The flow of blood slows down.  The inside of the vein is damaged in some way.  The person has a condition that makes the blood clot more easily. RISK FACTORS Some people are more likely than others to develop PE. Risk factors include:   Smoking.  Being overweight (obese).  Sitting or lying still for a long time. This includes long-distance travel, paralysis, or recovery from an illness or surgery. Other factors that increase risk are:   Older age, especially over 68 years of age.  Having a family history of blood clots or if you have already had a blood clot.  Having major or lengthy surgery. This is especially true for surgery on the hip, knee, or belly (abdomen). Hip surgery is particularly high risk.  Having a long, thin tube (catheter) placed inside a vein during a medical procedure.  Breaking a hip or leg.  Having cancer or cancer treatment.  Medicines containing the male hormone estrogen. This includes birth control pills and hormone replacement therapy.  Other circulation or heart problems.  Pregnancy and childbirth.  Hormone changes make the blood clot more easily during pregnancy.  The fetus puts pressure on the veins of the pelvis.  There is a risk of injury to veins during delivery or a caesarean delivery. The risk is highest  just after childbirth.  PREVENTION   Exercise the legs regularly. Take a brisk 30 minute walk every day.  Maintain a weight that is appropriate for your height.  Avoid sitting or lying in bed for long periods of time without moving your legs.  Women, particularly those over the age of 20 years, should consider the risks and benefits of taking estrogen medicines, including birth control pills.  Do not smoke, especially if you take estrogen medicines.  Long-distance travel can increase your risk. You should exercise your legs by walking or pumping the muscles every hour.  Many of the risk factors above relate to situations that exist with hospitalization, either for illness, injury, or elective surgery. Prevention may include medical and nonmedical measures.   Your health care provider will assess you for the need for venous thromboembolism prevention when you are admitted to the hospital. If you are having surgery, your [facility] will assess you the day of or day after surgery.  SYMPTOMS  The symptoms of a PE usually start suddenly and include:  Shortness of breath.  Coughing.  Coughing up blood or blood-tinged mucus.  Chest pain. Pain is often worse with deep breaths.  Rapid heartbeat. DIAGNOSIS  If a PE is suspected, your health care provider will take a medical history and perform a physical exam. Other tests that may be required include:  Blood tests, such as studies of the clotting properties of your blood.  Imaging tests, such as ultrasound, CT, MRI, and other tests  to see if you have clots in your legs or lungs.  An electrocardiogram. This can look for heart strain from blood clots in the lungs. TREATMENT   The most common treatment for a PE is blood thinning (anticoagulant) medicine, which reduces the blood's tendency to clot. Anticoagulants can stop new blood clots from forming and old clots from growing. They cannot dissolve existing clots. Your body does this by  itself over time. Anticoagulants can be given by mouth, through an intravenous (IV) tube, or by injection. Your health care provider will determine the best program for you.  Less commonly, clot-dissolving medicines (thrombolytics) are used to dissolve a PE. They carry a high risk of bleeding, so they are used mainly in severe cases.  Very rarely, a blood clot in the leg needs to be removed surgically.  If you are unable to take anticoagulants, your health care provider may arrange for you to have a filter placed in a main vein in your abdomen. This filter prevents clots from traveling to your lungs. HOME CARE INSTRUCTIONS   Take all medicines as directed by your health care provider.  Learn as much as you can about DVT.  Wear a medical alert bracelet or carry a medical alert card.  Ask your health care provider how soon you can go back to normal activities. It is important to stay active to prevent blood clots. If you are on anticoagulant medicine, avoid contact sports.  It is very important to exercise. This is especially important while traveling, sitting, or standing for long periods of time. Exercise your legs by walking or by tightening and relaxing your leg muscles regularly. Take frequent walks.  You may need to wear compression stockings. These are tight elastic stockings that apply pressure to the lower legs. This pressure can help keep the blood in the legs from clotting. Taking Warfarin Warfarin is a daily medicine that is taken by mouth. Your health care provider will advise you on the length of treatment (usually 3-6 months, sometimes lifelong). If you take warfarin:  Understand how to take warfarin and foods that can affect how warfarin works in [facility].  Too much and too little warfarin are both dangerous. Too much warfarin increases the risk of bleeding. Too little warfarin continues to allow the risk for blood clots. Warfarin and Regular Blood Testing While taking  warfarin, you will need to have regular blood tests to measure your blood clotting time. These blood tests usually include both the prothrombin time (PT) and international normalized ratio (INR) tests. The PT and INR results allow your health care provider to adjust your dose of warfarin. It is very important that you have your PT and INR tested as often as directed by your health care provider.  Warfarin and Your Diet Avoid major changes in your diet, or notify your health care provider before changing your diet. Arrange a visit with a registered dietitian to answer your questions. Many foods, especially foods high in vitamin K, can interfere with warfarin and affect the PT and INR results. You should eat a consistent amount of foods high in vitamin K. Foods high in vitamin K include:   Spinach, kale, broccoli, cabbage, collard and turnip greens, Brussels sprouts, peas, cauliflower, seaweed, and parsley.  Beef and pork liver.  Green tea.  Soybean oil. Warfarin with Other Medicines Many medicines can interfere with warfarin and affect the PT and INR results. You must:  Tell your health care provider about any and all medicines,  vitamins, and supplements you take, including aspirin and other over-the-counter anti-inflammatory medicines. Be especially cautious with aspirin and anti-inflammatory medicines. Ask your health care provider before taking these.  Do not take or discontinue any prescribed or over-the-counter medicine except on the advice of your health care provider or pharmacist. Warfarin Side Effects Warfarin can have side effects, such as easy bruising and difficulty stopping bleeding. Ask your health care provider or pharmacist about other side effects of warfarin. You will need to:  Hold pressure over cuts for longer than usual.  Notify your dentist and other health care providers that you are taking warfarin before you undergo any procedures where bleeding may occur. Warfarin with  Alcohol and Tobacco   Drinking alcohol frequently can increase the effect of warfarin, leading to excess bleeding. It is best to avoid alcoholic drinks or consume only very small amounts while taking warfarin. Notify your health care provider if you change your alcohol intake.  Do not use any tobacco products including cigarettes, chewing tobacco, or electronic cigarettes. If you smoke, quit. Ask your health care provider for help with quitting smoking. Alternative Medicines to Warfarin: Factor Xa Inhibitor Medicines  These blood thinning medicines are taken by mouth, usually for several weeks or longer. It is important to take the medicine every single day, at the same time each day.  There are no regular blood tests required when using these medicines.  There are fewer food and drug interactions than with warfarin.  The side effects of this class of medicine is similar to that of warfarin, including excessive bruising or bleeding. Ask your health care provider or pharmacist about other potential side effects. SEEK MEDICAL CARE IF:   You notice a rapid heartbeat.  You feel weaker or more tired than usual.  You feel faint.  You notice increased bruising.  Your symptoms are not getting better in the time expected.  You are having side effects of medicine. SEEK IMMEDIATE MEDICAL CARE IF:   You have chest pain.  You have trouble breathing.  You have new or increased swelling or pain in one leg.  You cough up blood.  You notice blood in vomit, in a bowel movement, or in urine.  You have a fever. Symptoms of PE may represent a serious problem that is an emergency. Do not wait to see if the symptoms will go away. Get medical help right away. Call your local emergency services (911 in the [place]). Do not drive yourself to the hospital. Document Released: 12/14/2000 Document Revised: 05/03/2014 Document Reviewed: 12/28/2013 [facility] Patient Information 2015 [place], [place].  This information is not intended to replace advice given to you by your health care provider. Make sure you discuss any questions you have with your health care provider.  Information on my medicine - XARELTO (rivaroxaban)  This medication education was reviewed with me or my healthcare representative as part of my discharge preparation.  The pharmacist that spoke with me during my hospital stay was:  Reginia Naas, [place]? Xarelto was prescribed to treat blood clots that may have been found in the veins of your legs (deep vein thrombosis) or in your lungs (pulmonary embolism) and to reduce the risk of them occurring again.  What do you need to know about Xarelto? The starting dose is one 15 mg tablet taken TWICE daily with food for the FIRST 21 DAYS then on  03/03/2015  the dose is changed to one 20 mg tablet  taken [place] with your evening meal.  DO NOT stop taking Xarelto without talking to the health care provider who prescribed the medication.  Refill your prescription for 20 mg tablets before you run out.  After discharge, you should have regular check-up appointments with your healthcare provider that is prescribing your Xarelto.  In the future your dose may need to be changed if your kidney function changes by a significant amount.  What do you do if you miss a dose? If you are taking Xarelto TWICE DAILY and you miss a dose, take it as soon as you remember. You may take two 15 mg tablets (total 30 mg) at the same time then resume your regularly scheduled 15 mg twice daily the next day.  If you are taking Xarelto ONCE DAILY and you miss a dose, take it as soon as you remember on the same day then continue your regularly scheduled once daily regimen the next day. Do not take two doses of Xarelto at the same time.   Important Safety Information Xarelto is a blood thinner medicine that can cause bleeding. You should call your healthcare  provider right away if you experience any of the following: ? Bleeding from an injury or your nose that does not stop. ? Unusual colored urine (red or dark brown) or unusual colored stools (red or black). ? Unusual bruising for unknown reasons. ? A serious fall or if you hit your head (even if there is no bleeding).  Some medicines may interact with Xarelto and might increase your risk of bleeding while on Xarelto. To help avoid this, consult your healthcare provider or pharmacist prior to using any new prescription or non-prescription medications, including herbals, vitamins, non-steroidal anti-inflammatory drugs (NSAIDs) and supplements.  This website has more information on Xarelto: [email or web address]

## 2015-02-09 NOTE — ED Provider Notes (Signed)
CSN: [id number]     Arrival date & time 02/09/15  1748 History   First MD Initiated Contact with Patient 02/09/15 1822     Chief Complaint  Patient presents with  . Abnormal Lab     (Consider location/radiation/quality/duration/timing/severity/associated sxs/prior Treatment) HPI   74 year old male with metastatic pancreatic cancer who was sent here to the ER for evaluation of a new discover PE incidentally on a CT scan which was performed today. Patient reports he initially found a painless lump to his right axillary region last November. He discussed with his primary care doctor who referred him to [facility] for [facility]Up. He Was Seen by Dr. Brantley Stage and Subsequently Had Imaging That Confirmed a Malignant Tumor. Dr. Brantley Stage Also Schedule a CT Scan Which He Had It Today. He Was Then Notified of the Results which include an incidental finding of a PE to his left lung. Suggest to come to ER for further evaluation. Currently patient denies having any chest pain, shortness of breath, productive cough, hemoptysis. He denies any prior history of PE. He did report having right lower leg pain ongoing for the past 2 weeks which his doctor is aware and has been giving him a cream to use. He admits that he has a strong family history of pancreatic cancer. He is also an insulin-dependent diabetes and was diagnosed at age of 44. He was not aware that he has pancreatic cancer and currently not receiving any types of cancer treatment. He denies having any other symptoms except noticing that his abdominal girth has been abnormal. Denies any abdominal pain.  Past Medical History  Diagnosis Date  . Aortic insufficiency   . Thoracic aortic aneurysm   . Hypertension   . Polymyositis   . Prostate cancer   . Diabetes mellitus   . Urinary retention    Past Surgical History  Procedure Laterality Date  . Radioactive seed implant    . US echocardiography  12/05/2010    EF 55-65%  . Cardiovascular stress test   06/12/2000    EF 59%   Family History  Problem Relation Age of Onset  . Stroke Father   . Hypertension Sister   . Diabetes Sister   . Heart attack Brother   . Hypertension Brother    History  Substance Use Topics  . Smoking status: Never Smoker   . Smokeless tobacco: Not on file  . Alcohol Use: No    Review of Systems  All other systems reviewed and are negative.     Allergies  Augmentin; Codeine; Lisinopril; and Sulfonamide derivatives  Home Medications   Prior to Admission medications   Medication Sig Start Date End Date Taking? Authorizing Provider  aspirin 81 MG tablet Take 81 mg by mouth daily.    Historical Provider, MD  azaTHIOprine (IMURAN) 50 MG tablet Take 50 mg by mouth daily.  12/14/14   Historical Provider, MD  AZOR 5-20 MG per tablet TAKE 1 TABLET BY MOUTH DAILY 01/31/15   Elayne Snare, MD  carvedilol (COREG) 25 MG tablet TAKE ONE TABLET BY MOUTH TWICE DAILY WITH MEALS 10/13/14   Elayne Snare, MD  Cholecalciferol (VITAMIN D-3) 1000 UNITS CAPS Take by mouth.    Historical Provider, MD  Cyanocobalamin (VITAMIN B 12 PO) Take 1,000 Units by mouth.    Historical Provider, MD  Diclofenac Sodium 3 % GEL Small amount 3x daily on inflamed area 01/19/15   Elayne Snare, MD  ferrous sulfate 325 (65 FE) MG tablet Take 325 mg by  mouth daily with breakfast.    Historical Provider, MD  finasteride (PROSCAR) 5 MG tablet Take 5 mg by mouth daily.      Historical Provider, MD  glimepiride (AMARYL) 1 MG tablet TAKE ONE TABLET BY MOUTH ONCE DAILY BEFORE  SUPPER 11/23/14   Elayne Snare, MD  Insulin Glargine (LANTUS SOLOSTAR) 100 UNIT/ML Solostar Pen Start Lantus insulin 10 units daily in the evening with a pen, may increase the dose by 2 units every 3 days if morning sugars still over 150 Patient taking differently: Start Lantus insulin 12 units daily in the evening with a pen, may increase the dose by 2 units every 3 days if morning sugars still over 150 12/01/14   Elayne Snare, MD  Insulin Pen  Needle (NOVOFINE) 32G X 6 MM MISC Use daily with Victoza 12/27/14   Elayne Snare, MD  leuprolide (LUPRON) 1 MG/0.2ML injection Inject as directed every 4 months    Historical Provider, MD  meclizine (ANTIVERT) 25 MG tablet Take 1 tablet (25 mg total) by mouth 3 (three) times daily as needed for dizziness. 01/28/15   Elayne Snare, MD  metFORMIN (GLUCOPHAGE) 500 MG tablet TAKE ONE TABLET BY MOUTH IN THE MORNING, THEN TAKE TWO AT NIGHT 02/03/15   Elayne Snare, MD  Multiple Vitamin (MULTIVITAMIN) tablet Take 1 tablet by mouth daily.      Historical Provider, MD  ONE TOUCH ULTRA TEST test strip USE TO CHECK BLOOD SUGAR THREE TIMES DAILY 11/29/14   Elayne Snare, MD  pioglitazone (ACTOS) 30 MG tablet TAKE ONE TABLET BY MOUTH ONCE DAILY 12/27/14   Elayne Snare, MD  simvastatin (ZOCOR) 20 MG tablet Take 1 tablet (20 mg total) by mouth at bedtime. 09/07/14   Elayne Snare, MD  sitaGLIPtin (JANUVIA) 100 MG tablet Take 1 tablet (100 mg total) by mouth daily. 11/01/14   Elayne Snare, MD   BP 128/54 mmHg  Pulse 99  Temp(Src) 97.9 F (36.6 C) (Oral)  Resp 20  SpO2 99% Physical Exam  Constitutional: He is oriented to person, place, and time. He appears well-developed and well-nourished. No distress.  HENT:  Head: Atraumatic.  Eyes: Conjunctivae are normal.  Neck: Normal range of motion. Neck supple.  Cardiovascular: Normal rate and regular rhythm.   Pulmonary/Chest: Effort normal and breath sounds normal. No respiratory distress. He has no wheezes. He exhibits no tenderness.  Abdominal: Soft.  Protuberant abdomen that is soft and nontender on palpation.  Genitourinary:  Normal anal tone, no mass, normal color stool but hemoccult positive  Musculoskeletal: He exhibits no edema.  Bilateral lower extremities with palpable cords noted to right lower extremities without any significant erythema or edema. Intact distal pulses.  Neurological: He is alert and oriented to person, place, and time.  Skin: No rash noted.   Psychiatric: He has a normal mood and affect.    ED Course  Procedures (including critical care time)  Patient with newly diagnosed metastatic pancreatic cancer with an incidental finding of a small PE to his left lower lobe who was sent here for further management. He is currently asymptomatic. Denies any chest pain, no hypoxia, and is hemodynamically stable. Attempt to contact his PCP, Dr. Dwyane Dee without success, we'll continue to try.  Care discussed with Dr. Eulis Foster.  7:58 PM I have not heard back from pt's PCP.  I have consulted pt's pulmonologist and spoke with oncall pulmonologist Dr. Chuck Hint who recommend hospitalist admission and initiate anticoagulants as pt will need a formal evaluation for management of his  PE.  Pt however prefers to return home and would like to follow up with his PCP for further care.  He sts that he is distraught of the finding of pancreatic cancer as his sister died from pancreatic cancer and his mom also died from metastatic cancer.  Therefore he does not want to be hospitalized tonight.  He agrees to sign AMA and understand that he can return if symptoms worsen.    8:12 PM I have consulted with pt's PCP oncall provider Dr. Jenny Reichmann who agrees that Xarelto is appropriate for this patient and pt will be contacted tomorrow for close follow up.  Pt will be given the first dose of Xarelto 15mg  PO tonight, and a starter pack will be prescribed.  Pt will follow up closely.  The remainder of the CT result was discussed with patient, but patient will need to discuss closely with his PCP in regard to the CT findings and its management.      9:08 PM Pt made aware that hemoccult blood is positive and he will need to notify his PCP about it as he will be taking Xarelto. Dr. Eulis Foster is aware.     Labs Review Labs Reviewed  CBC - Abnormal; Notable for the following:    RBC 3.28 (*)    Hemoglobin 9.9 (*)    HCT 29.8 (*)    All other components within normal limits  BASIC METABOLIC  PANEL - Abnormal; Notable for the following:    Sodium 133 (*)    Glucose, Bld 296 (*)    GFR calc non Af Amer 78 (*)    All other components within normal limits  I-STAT TROPOININ, ED  POC OCCULT BLOOD, ED    Imaging Review Ct Chest W Contrast  02/09/2015   CLINICAL DATA:  Metastatic cancer of unknown primary.  EXAM: CT CHEST, ABDOMEN, AND PELVIS WITH CONTRAST  TECHNIQUE: Multidetector CT imaging of the chest, abdomen and pelvis was performed following the standard protocol during bolus administration of intravenous contrast.  CONTRAST:  1104mL OMNIPAQUE IOHEXOL 300 MG/ML  SOLN  COMPARISON:  Chest CT 10/2018 2013  FINDINGS: CT CHEST FINDINGS  Chest wall: Ill-defined soft tissue mass involving the right lateral breast area with a tiny central calcification or marker. No supraclavicular or axillary lymphadenopathy. The largest node in the left axilla measures 14.5 mm. The bony thorax is intact. No destructive bone lesions or spinal canal compromise. Moderate degenerative changes involving the spine.  Mediastinum: The heart is normal in size for age. No pericardial effusion. There is a fusiform aneurysmal dilatation of ascending aorta with maximal measurements of 4.6 x 4.8 cm at the level of the right pulmonary artery. The descending thoracic aorta is normal in caliber. No dissection. Coronary artery calcifications are noted. No mediastinal or hilar mass or adenopathy. There are small scattered lymph nodes bilaterally.  There is a filling defect in the left lower lobe pulmonary artery consistent with pulmonary embolism. No definite right-sided disease but this is not a dedicated study.  Lungs/pleura: Chronic lung disease with emphysema and bronchitic changes. Scattered small bilateral pulmonary nodules suspicious for metastatic disease. Bilateral lower lobe peribronchial thickening streaky atelectasis without focal infiltrate. No pleural effusion.  CT ABDOMEN AND PELVIS FINDINGS  Hepatobiliary: Diffuse  hepatic metastatic disease involving both hepatic lobes. The largest lesion is an segment for and measures 4.3 x 3.9 cm. Segment 3 lesion measures 2.8 x 2.8 cm. Numerous smaller lesions in the right hepatic lobe.  Pancreas: There is a  pancreatic tail mass which is most likely adenocarcinoma and likely representing this patient's primary neoplasm. It measures 4.6 x 3.5 cm. I do not see any definite direct invasion of the stomach or spleen. The head and body region of the pancreas appear normal. There is a small duodenum diverticulum near the pancreatic head.  Spleen: Normal size.  No focal lesions.  Adrenals/Urinary Tract: Small indeterminate left thyroid gland nodule. The right thyroid gland is normal. Small lesion involving the right kidney at the upper pole midpole junction region laterally is likely a hemorrhagic cyst. There is a large cyst projection off the lower pole region of the right kidney. No hydronephrosis or obstructing ureteral calculi.  Stomach/Bowel: The stomach, duodenum, small bowel and colon are grossly normal. No inflammatory changes, mass lesions or obstructive findings. The terminal ileum is normal. The appendix is normal. Moderate stool throughout the colon suggesting constipation. Possible abnormal soft tissue thickening involving the cecum near the ileocecal valve. Could not exclude the possibility of a cecal mass. Colonoscopy or barium enema may be helpful for further evaluation. Is also a few borderline pericecal lymph nodes.  Vascular/Lymphatic: Small scattered mesenteric and retroperitoneal lymph nodes but no mass or overt adenopathy. The aorta and branch vessels are normal. The major venous structures are patent. Possible occlusion of the proximal splenic vein.  Pelvis: There are brachy therapy seeds in the prostate gland. The bladder is thick walled but no discrete mass. The rectum and sigmoid colon are grossly normal. No pelvic mass or adenopathy. No free pelvic fluid collections.  There is a left inguinal hernia containing small bowel loops but no obstruction.  Musculoskeletal: There are a few scattered vague osseous lucencies noted in the pelvis. Could not exclude metastatic disease.  IMPRESSION: 1. 4.6 x 3.5 cm pancreatic tail mass most consistent with pancreatic adenocarcinoma and likely responsible for the patient's metastatic disease involving the lungs and liver. No definite direct invasion of the stomach or spleen. Suspect obstruction of the proximal splenic vein. 2. A few scattered bone lucencies. Could not exclude metastatic disease. Bone scan or PET-CT may be helpful for further evaluation (if clinically necessary). 3. Possible cecal mass.  Recommend colonoscopy or barium enema. 4. Small scattered mesenteric and retroperitoneal lymph nodes and small pericecal lymph nodes. 5. Small amount of clot noted in the left lower lobe pulmonary artery consistent with pulmonary embolism. 6. Fusiform aneurysmal dilatation of the ascending aorta. Ascending thoracic aortic aneurysm. Recommend semi-annual imaging followup by CTA or MRA and referral to cardiothoracic surgery if not already obtained. This recommendation follows 2010 ACCF/AHA/AATS/ACR/ASA/SCA/SCAI/SIR/STS/SVM Guidelines for the Diagnosis and Management of Patients With Thoracic Aortic Disease. Circulation. 2010; 121: e266-e369 7. Ill-defined soft tissue mass in the right lateral breast area. These results will be called to the ordering clinician or representative by the Radiologist Assistant, and communication documented in the PACS or zVision Dashboard.   Electronically Signed   By: Marijo Sanes M.D.   On: 02/09/2015 16:23   Ct Abdomen Pelvis W Contrast  02/09/2015   CLINICAL DATA:  Metastatic cancer of unknown primary.  EXAM: CT CHEST, ABDOMEN, AND PELVIS WITH CONTRAST  TECHNIQUE: Multidetector CT imaging of the chest, abdomen and pelvis was performed following the standard protocol during bolus administration of intravenous  contrast.  CONTRAST:  134mL OMNIPAQUE IOHEXOL 300 MG/ML  SOLN  COMPARISON:  Chest CT 10/2018 2013  FINDINGS: CT CHEST FINDINGS  Chest wall: Ill-defined soft tissue mass involving the right lateral breast area with a tiny central calcification or  marker. No supraclavicular or axillary lymphadenopathy. The largest node in the left axilla measures 14.5 mm. The bony thorax is intact. No destructive bone lesions or spinal canal compromise. Moderate degenerative changes involving the spine.  Mediastinum: The heart is normal in size for age. No pericardial effusion. There is a fusiform aneurysmal dilatation of ascending aorta with maximal measurements of 4.6 x 4.8 cm at the level of the right pulmonary artery. The descending thoracic aorta is normal in caliber. No dissection. Coronary artery calcifications are noted. No mediastinal or hilar mass or adenopathy. There are small scattered lymph nodes bilaterally.  There is a filling defect in the left lower lobe pulmonary artery consistent with pulmonary embolism. No definite right-sided disease but this is not a dedicated study.  Lungs/pleura: Chronic lung disease with emphysema and bronchitic changes. Scattered small bilateral pulmonary nodules suspicious for metastatic disease. Bilateral lower lobe peribronchial thickening streaky atelectasis without focal infiltrate. No pleural effusion.  CT ABDOMEN AND PELVIS FINDINGS  Hepatobiliary: Diffuse hepatic metastatic disease involving both hepatic lobes. The largest lesion is an segment for and measures 4.3 x 3.9 cm. Segment 3 lesion measures 2.8 x 2.8 cm. Numerous smaller lesions in the right hepatic lobe.  Pancreas: There is a pancreatic tail mass which is most likely adenocarcinoma and likely representing this patient's primary neoplasm. It measures 4.6 x 3.5 cm. I do not see any definite direct invasion of the stomach or spleen. The head and body region of the pancreas appear normal. There is a small duodenum diverticulum  near the pancreatic head.  Spleen: Normal size.  No focal lesions.  Adrenals/Urinary Tract: Small indeterminate left thyroid gland nodule. The right thyroid gland is normal. Small lesion involving the right kidney at the upper pole midpole junction region laterally is likely a hemorrhagic cyst. There is a large cyst projection off the lower pole region of the right kidney. No hydronephrosis or obstructing ureteral calculi.  Stomach/Bowel: The stomach, duodenum, small bowel and colon are grossly normal. No inflammatory changes, mass lesions or obstructive findings. The terminal ileum is normal. The appendix is normal. Moderate stool throughout the colon suggesting constipation. Possible abnormal soft tissue thickening involving the cecum near the ileocecal valve. Could not exclude the possibility of a cecal mass. Colonoscopy or barium enema may be helpful for further evaluation. Is also a few borderline pericecal lymph nodes.  Vascular/Lymphatic: Small scattered mesenteric and retroperitoneal lymph nodes but no mass or overt adenopathy. The aorta and branch vessels are normal. The major venous structures are patent. Possible occlusion of the proximal splenic vein.  Pelvis: There are brachy therapy seeds in the prostate gland. The bladder is thick walled but no discrete mass. The rectum and sigmoid colon are grossly normal. No pelvic mass or adenopathy. No free pelvic fluid collections. There is a left inguinal hernia containing small bowel loops but no obstruction.  Musculoskeletal: There are a few scattered vague osseous lucencies noted in the pelvis. Could not exclude metastatic disease.  IMPRESSION: 1. 4.6 x 3.5 cm pancreatic tail mass most consistent with pancreatic adenocarcinoma and likely responsible for the patient's metastatic disease involving the lungs and liver. No definite direct invasion of the stomach or spleen. Suspect obstruction of the proximal splenic vein. 2. A few scattered bone lucencies. Could  not exclude metastatic disease. Bone scan or PET-CT may be helpful for further evaluation (if clinically necessary). 3. Possible cecal mass.  Recommend colonoscopy or barium enema. 4. Small scattered mesenteric and retroperitoneal lymph nodes and small pericecal  lymph nodes. 5. Small amount of clot noted in the left lower lobe pulmonary artery consistent with pulmonary embolism. 6. Fusiform aneurysmal dilatation of the ascending aorta. Ascending thoracic aortic aneurysm. Recommend semi-annual imaging followup by CTA or MRA and referral to cardiothoracic surgery if not already obtained. This recommendation follows 2010 ACCF/AHA/AATS/ACR/ASA/SCA/SCAI/SIR/STS/SVM Guidelines for the Diagnosis and Management of Patients With Thoracic Aortic Disease. Circulation. 2010; 121: e266-e369 7. Ill-defined soft tissue mass in the right lateral breast area. These results will be called to the ordering clinician or representative by the Radiologist Assistant, and communication documented in the PACS or zVision Dashboard.   Electronically Signed   By: Marijo Sanes M.D.   On: 02/09/2015 16:23     EKG Interpretation None      MDM   Final diagnoses:  Pulmonary embolism    BP 129/59 mmHg  Pulse 84  Temp(Src) 97.9 F (36.6 C) (Oral)  Resp 25  SpO2 100%  I have reviewed nursing notes and vital signs. I personally reviewed the imaging tests through PACS system  I reviewed available ER/hospitalization records thought the EMR    Domenic Moras, PA-C 02/09/15 2109  Richarda Blade, MD 02/09/15 2123

## 2015-02-09 NOTE — Telephone Encounter (Signed)
Received call per EDP  Pt presented with incidental finding PE on scanning;  Seen in ER, pt declined admit, hemodynamically stable  Pt to be treated with xarelto tonight  EDP asked me to forward this message as appropriate and help arrange follow up  Dr Dwyane Dee to review, as PCP of record  Will ask our schedulers to call pt to help arrange f/u asap with Dr Dwyane Dee, even Feb 11 if possible - Tammy to help please

## 2015-02-09 NOTE — ED Notes (Signed)
Dr. Eulis Foster is in to see the patient.

## 2015-02-09 NOTE — ED Provider Notes (Signed)
  Face-to-face evaluation   History: Patient was getting a CT scan done today, for shoulder pain, when it was noted that he had a pulmonary embolus, incidentally.  Physical exam: Alert, elderly man who is lucid, and cooperative, and calm.  Heart regular rate and rhythm, no murmur.  Lungs clear to auscultation.  Abdomen soft, nontender.  Legs bilateral pitting edema, right leg somewhat larger than left.  Assessment- incidental left lung PE, found on non-PE-dedicated, CT chest.  He may have other emboli, or other problems related to PE that would not be discerned on this type of study.  He probably has new finding of metastatic abdominal cancer at this time, unspecified.    Date: 02/19/15, 17:57  Rate: 95  Rhythm: normal sinus rhythm  QRS Axis: left  PR and QT Intervals: normal  ST/T Wave abnormalities: nonspecific ST changes  PR and QRS Conduction Disutrbances:none  Narrative Interpretation:   Old EKG Reviewed: unchanged    EKG Interpretation  Date/Time:  Wednesday February 09 2015 20:53:26 EST Ventricular Rate:  90 PR Interval:  181 QRS Duration: 98 QT Interval:  377 QTC Calculation: 461 R Axis:   -24 Text Interpretation:  Sinus rhythm Left ventricular hypertrophy   Since Last tracing (03/31/09) (Note: EKG earlier today at 17:57 is not in MUSE) Confirmed by [facility]  MD, Vira Agar ([id number]) on 02/09/2015 9:15:03 PM       Medical screening examination/treatment/procedure(s) were conducted as a shared visit with non-physician practitioner(s) and myself.  I personally evaluated the patient during the encounter  Richarda Blade, MD 02/09/15 2122

## 2015-02-09 NOTE — ED Notes (Signed)
Patient transported to X-ray 

## 2015-02-09 NOTE — ED Notes (Signed)
ekg given to Dr Wentz 

## 2015-02-09 NOTE — ED Notes (Signed)
Pt went for CT scan d/t his cancer (under R arm). Incidentally, they found a PE and told to come to ER. Pt denies cp, sob. Pt does reports swelling to R leg over past 2 weeks.

## 2015-02-09 NOTE — ED Notes (Signed)
Spoke with Gertie Fey, PA in regards to results, reviewed labs, and hemocult, use of straight cath at home. Patient allowed to receive xarelto and continuing straight cath as well. Discussed follow up and signs/symptoms to report. Pharmacist also came to educate patient on use of medication.

## 2015-02-10 ENCOUNTER — Telehealth: Payer: Self-pay | Admitting: *Deleted

## 2015-02-10 LAB — POC OCCULT BLOOD, ED: Fecal Occult Bld: POSITIVE — AB

## 2015-02-10 NOTE — Telephone Encounter (Signed)
I can not schedule for Endo.  Can you please schedule?

## 2015-02-10 NOTE — Telephone Encounter (Signed)
Received referral from [place].  Obtained appt from MD.  Called & confirmed 02/11/15 appt w/ pt.  Unable to mail before appt letter - gave verbal.  Unable to mail welcoming packet - gave directions and instructions.  Unable to mail intake form - placed a note for one to be given at time of check in.  Emailed [facility] [place] and [place] at [employer] to make them aware.  Added to spreadsheet.  Copied note and placed one in Dr. Geralyn Flash box and took one to HIM to scan.

## 2015-02-11 ENCOUNTER — Ambulatory Visit: Payer: Medicare PPO

## 2015-02-11 ENCOUNTER — Telehealth: Payer: Self-pay | Admitting: Hematology and Oncology

## 2015-02-11 ENCOUNTER — Encounter: Payer: Self-pay | Admitting: Hematology and Oncology

## 2015-02-11 ENCOUNTER — Encounter: Payer: Self-pay | Admitting: Emergency Medicine

## 2015-02-11 ENCOUNTER — Ambulatory Visit (HOSPITAL_BASED_OUTPATIENT_CLINIC_OR_DEPARTMENT_OTHER): Payer: Medicare PPO | Admitting: Hematology and Oncology

## 2015-02-11 ENCOUNTER — Ambulatory Visit (HOSPITAL_BASED_OUTPATIENT_CLINIC_OR_DEPARTMENT_OTHER): Payer: Medicare PPO

## 2015-02-11 VITALS — BP 161/60 | HR 77 | Temp 97.8°F | Resp 18 | Ht 75.0 in | Wt 207.5 lb

## 2015-02-11 DIAGNOSIS — C787 Secondary malignant neoplasm of liver and intrahepatic bile duct: Secondary | ICD-10-CM

## 2015-02-11 DIAGNOSIS — C801 Malignant (primary) neoplasm, unspecified: Secondary | ICD-10-CM

## 2015-02-11 DIAGNOSIS — C259 Malignant neoplasm of pancreas, unspecified: Secondary | ICD-10-CM

## 2015-02-11 DIAGNOSIS — D509 Iron deficiency anemia, unspecified: Secondary | ICD-10-CM

## 2015-02-11 DIAGNOSIS — D539 Nutritional anemia, unspecified: Secondary | ICD-10-CM | POA: Insufficient documentation

## 2015-02-11 DIAGNOSIS — C7889 Secondary malignant neoplasm of other digestive organs: Secondary | ICD-10-CM

## 2015-02-11 DIAGNOSIS — I2699 Other pulmonary embolism without acute cor pulmonale: Secondary | ICD-10-CM

## 2015-02-11 LAB — CBC WITH DIFFERENTIAL/PLATELET
BASO%: 0.2 % (ref 0.0–2.0)
Basophils Absolute: 0 10*3/uL (ref 0.0–0.1)
EOS%: 2.1 % (ref 0.0–7.0)
Eosinophils Absolute: 0.1 10*3/uL (ref 0.0–0.5)
HCT: 29.6 % — ABNORMAL LOW (ref 38.4–49.9)
HGB: 9.9 g/dL — ABNORMAL LOW (ref 13.0–17.1)
LYMPH%: 18.1 % (ref 14.0–49.0)
MCH: 30.7 pg (ref 27.2–33.4)
MCHC: 33.4 g/dL (ref 32.0–36.0)
MCV: 91.6 fL (ref 79.3–98.0)
MONO#: 0.9 10*3/uL (ref 0.1–0.9)
MONO%: 13 % (ref 0.0–14.0)
NEUT#: 4.4 10*3/uL (ref 1.5–6.5)
NEUT%: 66.6 % (ref 39.0–75.0)
PLATELETS: 168 10*3/uL (ref 140–400)
RBC: 3.23 10*6/uL — AB (ref 4.20–5.82)
RDW: 14.3 % (ref 11.0–14.6)
WBC: 6.6 10*3/uL (ref 4.0–10.3)
lymph#: 1.2 10*3/uL (ref 0.9–3.3)

## 2015-02-11 LAB — IRON AND TIBC CHCC
%SAT: 16 % — ABNORMAL LOW (ref 20–55)
Iron: 38 ug/dL — ABNORMAL LOW (ref 42–163)
TIBC: 244 ug/dL (ref 202–409)
UIBC: 206 ug/dL (ref 117–376)

## 2015-02-11 LAB — COMPREHENSIVE METABOLIC PANEL (CC13)
ALBUMIN: 3.5 g/dL (ref 3.5–5.0)
ALK PHOS: 112 U/L (ref 40–150)
ALT: 28 U/L (ref 0–55)
AST: 42 U/L — AB (ref 5–34)
Anion Gap: 8 mEq/L (ref 3–11)
BUN: 16.6 mg/dL (ref 7.0–26.0)
CO2: 26 mEq/L (ref 22–29)
Calcium: 9 mg/dL (ref 8.4–10.4)
Chloride: 103 mEq/L (ref 98–109)
Creatinine: 1.1 mg/dL (ref 0.7–1.3)
EGFR: 75 mL/min/{1.73_m2} — ABNORMAL LOW (ref >90)
Glucose: 301 mg/dl — ABNORMAL HIGH (ref 70–140)
Potassium: 4.6 mEq/L (ref 3.5–5.1)
SODIUM: 137 meq/L (ref 136–145)
Total Bilirubin: 0.41 mg/dL (ref 0.20–1.20)
Total Protein: 7.3 g/dL (ref 6.4–8.3)

## 2015-02-11 LAB — FOLATE: Folate: >20 ng/mL

## 2015-02-11 LAB — VITAMIN B12: VITAMIN B 12: 1658 pg/mL — AB (ref 211–911)

## 2015-02-11 LAB — FERRITIN CHCC: Ferritin: 297 ng/ml (ref 22–316)

## 2015-02-11 LAB — CANCER ANTIGEN 19-9: CA 19-9: >700 U/mL — ABNORMAL HIGH (ref <35.0)

## 2015-02-11 LAB — CEA: CEA: 219.5 ng/mL — ABNORMAL HIGH (ref 0.0–5.0)

## 2015-02-11 NOTE — Progress Notes (Signed)
[facility] CONSULT NOTE  Patient Care Team: Elayne Snare, MD as PCP - General  CHIEF COMPLAINTS/PURPOSE OF CONSULTATION:  Newly diagnosed metastatic pancreatic cancer  HISTORY OF PRESENTING ILLNESS:  Mark Daugherty 74 y.o. male is here because of recent diagnosis of metastatic carcinoma. Patient presented with a small plaque-like subcutaneous abnormality in the right chest wall and he was evaluated by his primary care physician who referred him to Dr. Brantley Stage subsequently referred the patient to the breast center where he underwent imaging and biopsy of the right breast mass along with left axillary lymph node biopsy. He also had CT chest abdomen pelvis which demonstrated large pancreatic tail mass along with multiple liver metastases in addition to mesenteric and retroperitoneal lymph node metastases, small lung nodules that were suspicious for metastatic disease. He also had a few bone lesions in his pelvis. He was referred to [place] for discussion regarding treatment options. He was noted to have a small pulmonary embolism for which she was started on Zar[place] emergency room. He wishes to stop Xarelto at this time because he's had blood in his stool and his urine as well as cough which was productive for blood-tinged sputum. He is accompanied today by his wife and his son. He reports that he had lost about 20 pounds in the last year. His appetite had been slowly decreasing. He does not have any specific abdominal pain. He otherwise feels fairly well and has normal energy levels.  I reviewed her records extensively and collaborated the history with the patient.  SUMMARY OF ONCOLOGIC HISTORY:   Primary pancreatic cancer with metastasis to other site   02/03/2015 Initial Diagnosis Left breast biopsy: Adenocarcinoma with perineural invasion positive for CDX2 and CK 20, negative for ER/PR GCDFP, prostein, PSA and TTF-1 faver gastric or pancreatic or biliary origin   02/09/2015 Imaging  Pancreatic tail mass 4.6 cm, right breast mass, right axillary lymph nodes 1.4cm, small lung nodules, diffuse liver metastases largest 4.3 cm, numerous smaller metastases, scattered vague bone metastases in the pelvis    MEDICAL HISTORY:  Past Medical History  Diagnosis Date  . Aortic insufficiency   . Thoracic aortic aneurysm   . Hypertension   . Polymyositis   . Prostate cancer   . Diabetes mellitus   . Urinary retention     SURGICAL HISTORY: Past Surgical History  Procedure Laterality Date  . Radioactive seed implant    . US echocardiography  12/05/2010    EF 55-65%  . Cardiovascular stress test  06/12/2000    EF 59%    SOCIAL HISTORY: History   Social History  . Marital Status: Married    Spouse Name: N/A  . Number of Children: 2  . Years of Education: N/A   Occupational History  . teacher-retired    Social History Main Topics  . Smoking status: Never Smoker   . Smokeless tobacco: Not on file  . Alcohol Use: No  . Drug Use: No  . Sexual Activity: Not on file   Other Topics Concern  . Not on file   Social History Narrative    FAMILY HISTORY: Family History  Problem Relation Age of Onset  . Stroke Father   . Hypertension Sister   . Diabetes Sister   . Heart attack Brother   . Hypertension Brother   On family history, his father died of bladder cancer diagnosed at age 62 Mother was diagnosed with lung cancer metastatic disease at age 73 and she died because of it  Sister was diagnosed in her early 47s of pancreatic cancer and she died within 6 months of diagnosis  ALLERGIES:  is allergic to augmentin; codeine; lisinopril; and sulfonamide derivatives.  MEDICATIONS:  Current Outpatient Prescriptions  Medication Sig Dispense Refill  . azaTHIOprine (IMURAN) 50 MG tablet Take 50 mg by mouth daily.     . AZOR 5-20 MG per tablet TAKE 1 TABLET BY MOUTH DAILY 90 tablet 1  . carvedilol (COREG) 25 MG tablet TAKE ONE TABLET BY MOUTH TWICE DAILY WITH MEALS 180  tablet 1  . Cholecalciferol (VITAMIN D-3) 1000 UNITS CAPS Take by mouth.    . Cyanocobalamin (VITAMIN B 12 PO) Take 1,000 Units by mouth.    . ferrous sulfate 325 (65 FE) MG tablet Take 325 mg by mouth daily with breakfast.    . finasteride (PROSCAR) 5 MG tablet Take 5 mg by mouth daily.      Marland Kitchen glimepiride (AMARYL) 1 MG tablet TAKE ONE TABLET BY MOUTH ONCE DAILY BEFORE  SUPPER 30 tablet 5  . Insulin Glargine (LANTUS SOLOSTAR) 100 UNIT/ML Solostar Pen Start Lantus insulin 10 units daily in the evening with a pen, may increase the dose by 2 units every 3 days if morning sugars still over 150 (Patient taking differently: Start Lantus insulin 12 units daily in the evening with a pen, may increase the dose by 2 units every 3 days if morning sugars still over 150) 5 pen 2  . Insulin Pen Needle (NOVOFINE) 32G X 6 MM MISC Use daily with Victoza 50 each 5  . leuprolide (LUPRON) 1 MG/0.2ML injection Inject as directed every 4 months    . meclizine (ANTIVERT) 25 MG tablet Take 1 tablet (25 mg total) by mouth 3 (three) times daily as needed for dizziness. 30 tablet 0  . metFORMIN (GLUCOPHAGE) 500 MG tablet TAKE ONE TABLET BY MOUTH IN THE MORNING, THEN TAKE TWO AT NIGHT 90 tablet 3  . Multiple Vitamin (MULTIVITAMIN) tablet Take 1 tablet by mouth daily.      . ONE TOUCH ULTRA TEST test strip USE TO CHECK BLOOD SUGAR THREE TIMES DAILY 300 each 1  . pioglitazone (ACTOS) 30 MG tablet TAKE ONE TABLET BY MOUTH ONCE DAILY 90 tablet 1  . simvastatin (ZOCOR) 20 MG tablet Take 1 tablet (20 mg total) by mouth at bedtime. 90 tablet 3  . sitaGLIPtin (JANUVIA) 100 MG tablet Take 1 tablet (100 mg total) by mouth daily. 30 tablet 2  . XARELTO STARTER PACK 15 & 20 MG TBPK Take 15-20 mg by mouth as directed. Take as directed on package: Start with one 15mg  tablet by mouth twice a day with food. On Day 22, switch to one 20mg  tablet once a day with food. 51 each 0   No current facility-administered medications for this visit.     REVIEW OF SYSTEMS:   Constitutional: Denies fevers, chills or abnormal night sweats Eyes: Denies blurriness of vision, double vision or watery eyes Ears, nose, mouth, throat, and face: Blood-tinged sputum Respiratory: Denies cough, dyspnea or wheezes Cardiovascular: Denies palpitation, chest discomfort or lower extremity swelling Gastrointestinal: Bloated sensation in the abdomen Skin: Denies abnormal skin rashes Lymphatics: Denies new lymphadenopathy or easy bruising Neurological:Denies numbness, tingling or new weaknesses Behavioral/Psych: Mood is stable, no new changes  Breast: Palpable mass in the right breast All other systems were reviewed with the patient and are negative.  PHYSICAL EXAMINATION: ECOG PERFORMANCE STATUS: 1 - Symptomatic but completely ambulatory  Filed Vitals:   02/11/15 0998  BP: 161/60  Pulse: 77  Temp: 97.8 F (36.6 C)  Resp: 18   Filed Weights   02/11/15 0812  Weight: 207 lb 8 oz (94.121 kg)    GENERAL:alert, no distress and comfortable SKIN: skin color, texture, turgor are normal, no rashes or significant lesions EYES: normal, conjunctiva are pink and non-injected, sclera clear OROPHARYNX:no exudate, no erythema and lips, buccal mucosa, and tongue normal  NECK: supple, thyroid normal size, non-tender, without nodularity LYMPH:  no palpable lymphadenopathy in the cervical, axillary or inguinal LUNGS: clear to auscultation and percussion with normal breathing effort HEART: regular rate & rhythm and no murmurs and no lower extremity edema ABDOMEN:abdomen soft, non-tender and normal bowel sounds Musculoskeletal:no cyanosis of digits and no clubbing  PSYCH: alert & oriented x 3 with fluent speech NEURO: no focal motor/sensory deficits BREAST: Palpable mass in the right breast LABORATORY DATA:  I have reviewed the data as listed Lab Results  Component Value Date   WBC 7.6 02/09/2015   HGB 9.9* 02/09/2015   HCT 29.8* 02/09/2015   MCV 90.9  02/09/2015   PLT 192 02/09/2015   Lab Results  Component Value Date   NA 133* 02/09/2015   K 3.9 02/09/2015   CL 100 02/09/2015   CO2 25 02/09/2015    RADIOGRAPHIC STUDIES: I have personally reviewed the radiological reports and agreed with the findings in the report. CT scans were reviewed extensively with the patient  ASSESSMENT AND PLAN:  Primary pancreatic cancer with metastasis to other site Metastatic adenocarcinoma highly suspicious for pancreatic origin with extensive involvement of a 4.6 x 3.5 cm pancreatic tail mass with extensive liver metastases largest measuring 4.3 x 3.9 cm second largest 2.8 x 2.8 cm numerous small liver metastases, small lung nodules, right breast mass with right axillary lymph nodes, small mesenteric retroperitoneal lymph nodes, the bone metastases involving the pelvis. Left breast biopsy adenocarcinoma with perineural invasion positive for CD X2 and CK 20, focal positive for CK 7, negative for ER/PR GCDFP,prostein, PSA and TTF-1  Counseling: I discussed the pathology report as well as radiology reports with the patient extensively. I discussed with him that it is most consistent with metastatic pancreatic cancer. I discussed the prognosis for metastatic pancreatic cancer and that it is incurable. Treatment such as gemcitabine can help palliate his symptoms. More aggressive chemotherapy regimens include gemcitabine and Abraxane have been shown to prolong survival but by only a few weeks.  Treatment options offered 1. Palliative chemotherapy with gemcitabine 2. More aggressive chemotherapy with gemcitabine and Abraxane with data showing improved survival but comes with toxicities 3. Hospice care  I discussed extensively all of these options including the risks and benefits. I discussed the chemotherapy side effects including nausea vomiting had lost decrease in blood counts fatigue neuropathy risks etc. Patient is planning to sit down with the rest of his  family and make up his decision.  At this time he is leaning towards signing up with hospice for end-of-life care. Patient is very calm and content with his life and it appears that he is reconciled regarding this diagnosis and its dismal prognosis.  Iron deficiency anemia: Due to GI/ GU bleeding related to Xarelto. We are checking iron studies today and we will see if you could benefit from intravenous iron therapy. I'm also sending for CA 19-9 along with CEA.  Return to clinic based on his decision regarding treatment. If he wishes to sign up with hospice we will make that referral.   All  questions were answered. The patient knows to call the clinic with any problems, questions or concerns.    Rulon Eisenmenger, MD 9:21 AM   sa or wrist cancer and radiation and gonorrhea so he probably would not.vg

## 2015-02-11 NOTE — Telephone Encounter (Signed)
, °

## 2015-02-11 NOTE — Assessment & Plan Note (Signed)
Metastatic adenocarcinoma highly suspicious for pancreatic origin with extensive involvement of a 4.6 x 3.5 cm pancreatic tail mass with extensive liver metastases largest measuring 4.3 x 3.9 cm second largest 2.8 x 2.8 cm numerous small liver metastases, small lung nodules, right breast mass with right axillary lymph nodes, small mesenteric retroperitoneal lymph nodes, the bone metastases involving the pelvis. Left breast biopsy adenocarcinoma with perineural invasion positive for CD X2 and CK 20, focal positive for CK 7, negative for ER/PR GCDFP,prostein, PSA and TTF-1  Counseling: I discussed the pathology report as well as radiology reports with the patient extensively. I discussed with him that it is most consistent with metastatic pancreatic cancer. I discussed the prognosis for metastatic pancreatic cancer and that it is incurable. Treatment such as gemcitabine can help palliate his symptoms. More aggressive chemotherapy regimens include gemcitabine and Abraxane have been shown to prolong survival but by only a few weeks.  Treatment options offered 1. Palliative chemotherapy with gemcitabine 2. More aggressive chemotherapy with gemcitabine and Abraxane with data showing improved survival but comes with toxicities 3. Hospice care

## 2015-02-11 NOTE — Progress Notes (Signed)
Checked in new pt with no financial concerns prior to seeing the dr.  Informed pt if chemo is part of his treatment I will contact his ins to see if Josem Kaufmann is req and will obtain it if it is as well as contact foundations that offer copay assistance if needed.  He has my card for any billing questions or concerns.

## 2015-02-15 ENCOUNTER — Other Ambulatory Visit: Payer: Medicare PPO

## 2015-02-15 ENCOUNTER — Telehealth: Payer: Self-pay

## 2015-02-15 NOTE — Telephone Encounter (Signed)
Yvette from [facility] called stating that pt is opting for no chemo and will be going forward with hospice. She is asking if Dr Lindi Adie will be attending with hospice involved in symptom management. S/w Terri Rn and she said Dr Lindi Adie does agree to this arrangment. Called Yvette back and lvm.

## 2015-02-21 ENCOUNTER — Telehealth: Payer: Self-pay

## 2015-02-21 ENCOUNTER — Other Ambulatory Visit: Payer: Medicare PPO

## 2015-02-21 ENCOUNTER — Other Ambulatory Visit: Payer: Self-pay | Admitting: *Deleted

## 2015-02-21 DIAGNOSIS — Z1211 Encounter for screening for malignant neoplasm of colon: Secondary | ICD-10-CM

## 2015-02-21 LAB — HEMOCCULT SLIDES (X 3 CARDS)
Fecal Occult Blood: NEGATIVE
OCCULT 1: NEGATIVE
OCCULT 2: NEGATIVE
OCCULT 3: NEGATIVE
OCCULT 4: NEGATIVE
OCCULT 5: NEGATIVE

## 2015-02-21 NOTE — Telephone Encounter (Signed)
Returned call to hospice, left msg that per Dr. Lindi Adie, pt does not need iron infusion.

## 2015-02-21 NOTE — Progress Notes (Signed)
Quick Note:  Please let patient know that the lab result is normal and no further action needed ______ 

## 2015-02-21 NOTE — Telephone Encounter (Signed)
Standing Orders for [facility] place signed and faxed to hospice.  Sent to scan.

## 2015-02-22 ENCOUNTER — Telehealth: Payer: Self-pay | Admitting: Hematology and Oncology

## 2015-02-22 NOTE — Telephone Encounter (Signed)
returned call to wife re cx 2/25 genetics appt. s/w wife and appt cxd. per wife pt does not feel like coming for this appt and will call back to r/s if he wishes to come in. no further appts were requested per 2/12 pof.

## 2015-02-24 ENCOUNTER — Ambulatory Visit: Payer: Medicare PPO | Admitting: Endocrinology

## 2015-02-24 ENCOUNTER — Other Ambulatory Visit: Payer: Medicare PPO

## 2015-03-14 ENCOUNTER — Ambulatory Visit: Payer: Medicare PPO | Admitting: Cardiology

## 2015-04-08 ENCOUNTER — Telehealth: Payer: Self-pay | Admitting: *Deleted

## 2015-04-11 ENCOUNTER — Telehealth: Payer: Self-pay | Admitting: Hematology and Oncology

## 2015-04-11 NOTE — Telephone Encounter (Signed)
DEATH CERTIFICATE RECEIVED.  °

## 2015-04-19 ENCOUNTER — Telehealth: Payer: Self-pay

## 2015-04-19 NOTE — Telephone Encounter (Signed)
Received notification from  Hospice rthat patient passed away 2015-05-01.  Sent to scan.  Routed to HIM and Dr. Lindi Adie.

## 2015-04-22 ENCOUNTER — Telehealth: Payer: Self-pay

## 2015-04-22 NOTE — Telephone Encounter (Signed)
Signed orders faxed to hospice.  Sent to scan.

## 2015-05-01 NOTE — Telephone Encounter (Signed)
THIS NOTE WAS ROUTED TO [place].

## 2015-05-01 DEATH — deceased

## 2015-05-09 ENCOUNTER — Telehealth: Payer: Self-pay

## 2015-05-09 NOTE — Telephone Encounter (Signed)
Signed order faxed

## 2015-05-09 NOTE — Telephone Encounter (Signed)
Amy from [facility] called stating they need an order signed. It was the certification statement for the 1st 90 day period of hospice. This was an order from February. Called Amy back and asked her to resend this form to Dr Geralyn Flash pod.

## 2015-07-11 ENCOUNTER — Ambulatory Visit: Payer: Medicare PPO | Admitting: Pulmonary Disease
